# Patient Record
Sex: Male | Born: 1970 | Race: Black or African American | Hispanic: No | Marital: Single | Smoking: Never smoker
Health system: Southern US, Community
[De-identification: ages and names within clinical notes are randomized; demographics above are authoritative.]

## PROBLEM LIST (undated history)

## (undated) DIAGNOSIS — I10 Essential (primary) hypertension: Secondary | ICD-10-CM

## (undated) DIAGNOSIS — R7401 Elevation of levels of liver transaminase levels: Secondary | ICD-10-CM

## (undated) DIAGNOSIS — B20 Human immunodeficiency virus [HIV] disease: Secondary | ICD-10-CM

## (undated) DIAGNOSIS — K716 Toxic liver disease with hepatitis, not elsewhere classified: Secondary | ICD-10-CM

## (undated) DIAGNOSIS — R740 Nonspecific elevation of levels of transaminase and lactic acid dehydrogenase [LDH]: Secondary | ICD-10-CM

## (undated) DIAGNOSIS — Z21 Asymptomatic human immunodeficiency virus [HIV] infection status: Secondary | ICD-10-CM

## (undated) DIAGNOSIS — R7989 Other specified abnormal findings of blood chemistry: Secondary | ICD-10-CM

## (undated) DIAGNOSIS — D649 Anemia, unspecified: Secondary | ICD-10-CM

## (undated) DIAGNOSIS — N62 Hypertrophy of breast: Secondary | ICD-10-CM

## (undated) DIAGNOSIS — T50905A Adverse effect of unspecified drugs, medicaments and biological substances, initial encounter: Secondary | ICD-10-CM

## (undated) HISTORY — DX: Asymptomatic human immunodeficiency virus (hiv) infection status: Z21

## (undated) HISTORY — DX: Nonspecific elevation of levels of transaminase and lactic acid dehydrogenase (ldh): R74.0

## (undated) HISTORY — DX: Elevation of levels of liver transaminase levels: R74.01

## (undated) HISTORY — DX: Toxic liver disease with hepatitis, not elsewhere classified: K71.6

## (undated) HISTORY — DX: Adverse effect of unspecified drugs, medicaments and biological substances, initial encounter: T50.905A

## (undated) HISTORY — DX: Essential (primary) hypertension: I10

## (undated) HISTORY — DX: Human immunodeficiency virus (HIV) disease: B20

---
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
## 2001-11-23 ENCOUNTER — Emergency Department (HOSPITAL_COMMUNITY): Admission: EM | Admit: 2001-11-23 | Discharge: 2001-11-23 | Payer: Self-pay | Admitting: *Deleted

## 2006-07-21 ENCOUNTER — Ambulatory Visit: Payer: Self-pay | Admitting: Internal Medicine

## 2006-07-21 LAB — CONVERTED CEMR LAB
ALT: 26 units/L (ref 0–40)
AST: 21 units/L (ref 0–37)
Albumin: 4 g/dL (ref 3.5–5.2)
Alkaline Phosphatase: 66 units/L (ref 39–117)
BUN: 14 mg/dL (ref 6–23)
Basophils Absolute: 0 10*3/uL (ref 0.0–0.1)
Basophils Relative: 0.3 % (ref 0.0–1.0)
Bilirubin, Direct: 0.1 mg/dL (ref 0.0–0.3)
CO2: 27 meq/L (ref 19–32)
Calcium: 9.2 mg/dL (ref 8.4–10.5)
Chloride: 105 meq/L (ref 96–112)
Cholesterol: 150 mg/dL (ref 0–200)
Creatinine, Ser: 1 mg/dL (ref 0.4–1.5)
Eosinophils Absolute: 0.1 10*3/uL (ref 0.0–0.6)
Eosinophils Relative: 1.2 % (ref 0.0–5.0)
GFR calc Af Amer: 109 mL/min
GFR calc non Af Amer: 90 mL/min
Glucose, Bld: 92 mg/dL (ref 70–99)
HCT: 40 % (ref 39.0–52.0)
HDL: 43.8 mg/dL (ref >39.0)
Hemoglobin: 12.8 g/dL — ABNORMAL LOW (ref 13.0–17.0)
LDL Cholesterol: 87 mg/dL (ref 0–99)
Lymphocytes Relative: 31.8 % (ref 12.0–46.0)
MCHC: 32 g/dL (ref 30.0–36.0)
MCV: 67.7 fL — ABNORMAL LOW (ref 78.0–100.0)
Monocytes Absolute: 0.6 10*3/uL (ref 0.2–0.7)
Monocytes Relative: 10.1 % (ref 3.0–11.0)
Neutro Abs: 3.4 10*3/uL (ref 1.4–7.7)
Neutrophils Relative %: 56.6 % (ref 43.0–77.0)
Platelets: 198 10*3/uL (ref 150–400)
Potassium: 4.1 meq/L (ref 3.5–5.1)
RBC: 5.91 M/uL — ABNORMAL HIGH (ref 4.22–5.81)
RDW: 13.9 % (ref 11.5–14.6)
Sodium: 139 meq/L (ref 135–145)
TSH: 0.74 microintl units/mL (ref 0.35–5.50)
Total Bilirubin: 0.7 mg/dL (ref 0.3–1.2)
Total CHOL/HDL Ratio: 3.4
Total Protein: 7.4 g/dL (ref 6.0–8.3)
Triglycerides: 97 mg/dL (ref 0–149)
VLDL: 19 mg/dL (ref 0–40)
WBC: 6 10*3/uL (ref 4.5–10.5)

## 2006-07-28 ENCOUNTER — Ambulatory Visit: Payer: Self-pay | Admitting: Internal Medicine

## 2006-07-28 LAB — CONVERTED CEMR LAB
HIV-1 antibody: POSITIVE — AB
HIV-2 Ab: UNDETERMINED — AB
HIV: REACTIVE

## 2006-10-08 ENCOUNTER — Ambulatory Visit: Payer: Self-pay | Admitting: Internal Medicine

## 2006-10-08 ENCOUNTER — Encounter: Admission: RE | Admit: 2006-10-08 | Discharge: 2006-10-08 | Payer: Self-pay | Admitting: Internal Medicine

## 2006-10-08 DIAGNOSIS — B20 Human immunodeficiency virus [HIV] disease: Secondary | ICD-10-CM

## 2006-10-08 DIAGNOSIS — A749 Chlamydial infection, unspecified: Secondary | ICD-10-CM

## 2006-10-23 ENCOUNTER — Ambulatory Visit: Payer: Self-pay | Admitting: Internal Medicine

## 2006-11-04 ENCOUNTER — Encounter (INDEPENDENT_AMBULATORY_CARE_PROVIDER_SITE_OTHER): Payer: Self-pay | Admitting: *Deleted

## 2006-11-11 ENCOUNTER — Encounter (INDEPENDENT_AMBULATORY_CARE_PROVIDER_SITE_OTHER): Payer: Self-pay | Admitting: *Deleted

## 2006-11-13 ENCOUNTER — Encounter: Payer: Self-pay | Admitting: Internal Medicine

## 2006-12-30 ENCOUNTER — Ambulatory Visit: Payer: Self-pay | Admitting: Internal Medicine

## 2006-12-30 ENCOUNTER — Encounter: Admission: RE | Admit: 2006-12-30 | Discharge: 2006-12-30 | Payer: Self-pay | Admitting: Internal Medicine

## 2006-12-30 LAB — CONVERTED CEMR LAB
AST: 25 units/L (ref 0–37)
Albumin: 4.4 g/dL (ref 3.5–5.2)
BUN: 13 mg/dL (ref 6–23)
CO2: 28 meq/L (ref 19–32)
Calcium: 9.9 mg/dL (ref 8.4–10.5)
Chloride: 103 meq/L (ref 96–112)
Glucose, Bld: 101 mg/dL — ABNORMAL HIGH (ref 70–99)
HIV 1 RNA Quant: 193000 copies/mL — ABNORMAL HIGH (ref <50)
HIV-1 RNA Quant, Log: 5.29 — ABNORMAL HIGH (ref <1.70)
Hemoglobin: 12.6 g/dL — ABNORMAL LOW (ref 13.0–17.0)
Lymphocytes Relative: 41 % (ref 12–46)
Lymphs Abs: 2.2 10*3/uL (ref 0.7–3.3)
Monocytes Relative: 8 % (ref 3–11)
Neutro Abs: 2.6 10*3/uL (ref 1.7–7.7)
Neutrophils Relative %: 48 % (ref 43–77)
Potassium: 4.1 meq/L (ref 3.5–5.3)
RBC: 5.64 M/uL (ref 4.22–5.81)
WBC: 5.4 10*3/uL (ref 4.0–10.5)

## 2007-01-11 ENCOUNTER — Encounter (INDEPENDENT_AMBULATORY_CARE_PROVIDER_SITE_OTHER): Payer: Self-pay | Admitting: *Deleted

## 2007-01-13 ENCOUNTER — Ambulatory Visit: Payer: Self-pay | Admitting: Internal Medicine

## 2007-03-25 ENCOUNTER — Ambulatory Visit: Payer: Self-pay | Admitting: Internal Medicine

## 2007-03-25 ENCOUNTER — Encounter: Admission: RE | Admit: 2007-03-25 | Discharge: 2007-03-25 | Payer: Self-pay | Admitting: Internal Medicine

## 2007-03-25 LAB — CONVERTED CEMR LAB
AST: 17 units/L (ref 0–37)
Albumin: 4.3 g/dL (ref 3.5–5.2)
Alkaline Phosphatase: 80 units/L (ref 39–117)
Eosinophils Absolute: 0.1 10*3/uL (ref 0.0–0.7)
Glucose, Bld: 82 mg/dL (ref 70–99)
HIV-1 RNA Quant, Log: 5.34 — ABNORMAL HIGH (ref <1.70)
Lymphs Abs: 2 10*3/uL (ref 0.7–3.3)
MCV: 69.8 fL — ABNORMAL LOW (ref 78.0–100.0)
Neutro Abs: 2 10*3/uL (ref 1.7–7.7)
Neutrophils Relative %: 42 % — ABNORMAL LOW (ref 43–77)
Platelets: 184 10*3/uL (ref 150–400)
Potassium: 4.2 meq/L (ref 3.5–5.3)
Sodium: 140 meq/L (ref 135–145)
Total Protein: 7.8 g/dL (ref 6.0–8.3)
WBC: 4.7 10*3/uL (ref 4.0–10.5)

## 2007-04-13 ENCOUNTER — Ambulatory Visit: Payer: Self-pay | Admitting: Internal Medicine

## 2007-05-17 ENCOUNTER — Encounter: Payer: Self-pay | Admitting: Internal Medicine

## 2007-05-18 ENCOUNTER — Encounter (INDEPENDENT_AMBULATORY_CARE_PROVIDER_SITE_OTHER): Payer: Self-pay | Admitting: *Deleted

## 2007-06-16 ENCOUNTER — Encounter: Admission: RE | Admit: 2007-06-16 | Discharge: 2007-06-16 | Payer: Self-pay | Admitting: Internal Medicine

## 2007-06-16 ENCOUNTER — Ambulatory Visit: Payer: Self-pay | Admitting: Internal Medicine

## 2007-06-16 LAB — CONVERTED CEMR LAB
ALT: 20 units/L (ref 0–53)
Alkaline Phosphatase: 95 units/L (ref 39–117)
Basophils Absolute: 0 10*3/uL (ref 0.0–0.1)
Eosinophils Absolute: 0.2 10*3/uL (ref 0.0–0.7)
Eosinophils Relative: 3 % (ref 0–5)
HCT: 38.5 % — ABNORMAL LOW (ref 39.0–52.0)
HIV-1 RNA Quant, Log: 5.49 — ABNORMAL HIGH (ref <1.70)
MCV: 68.5 fL — ABNORMAL LOW (ref 78.0–100.0)
Platelets: 204 10*3/uL (ref 150–400)
RDW: 15.3 % (ref 11.5–15.5)
Sodium: 142 meq/L (ref 135–145)
Total Bilirubin: 0.4 mg/dL (ref 0.3–1.2)
Total Protein: 8.3 g/dL (ref 6.0–8.3)

## 2007-07-06 ENCOUNTER — Encounter (INDEPENDENT_AMBULATORY_CARE_PROVIDER_SITE_OTHER): Payer: Self-pay | Admitting: *Deleted

## 2007-07-07 ENCOUNTER — Ambulatory Visit: Payer: Self-pay | Admitting: Internal Medicine

## 2007-10-04 ENCOUNTER — Encounter: Admission: RE | Admit: 2007-10-04 | Discharge: 2007-10-04 | Payer: Self-pay | Admitting: Internal Medicine

## 2007-10-04 ENCOUNTER — Ambulatory Visit: Payer: Self-pay | Admitting: Internal Medicine

## 2007-10-04 LAB — CONVERTED CEMR LAB
ALT: 19 units/L (ref 0–53)
BUN: 14 mg/dL (ref 6–23)
Basophils Absolute: 0 10*3/uL (ref 0.0–0.1)
CO2: 26 meq/L (ref 19–32)
Creatinine, Ser: 1.07 mg/dL (ref 0.40–1.50)
Eosinophils Relative: 2 % (ref 0–5)
HCT: 36.2 % — ABNORMAL LOW (ref 39.0–52.0)
HIV 1 RNA Quant: 207000 copies/mL — ABNORMAL HIGH (ref <50)
HIV-1 RNA Quant, Log: 5.32 — ABNORMAL HIGH (ref <1.70)
Hemoglobin: 11.2 g/dL — ABNORMAL LOW (ref 13.0–17.0)
Lymphocytes Relative: 47 % — ABNORMAL HIGH (ref 12–46)
MCHC: 30.9 g/dL (ref 30.0–36.0)
Monocytes Absolute: 0.4 10*3/uL (ref 0.1–1.0)
Monocytes Relative: 11 % (ref 3–12)
RDW: 15 % (ref 11.5–15.5)
Total Bilirubin: 0.4 mg/dL (ref 0.3–1.2)

## 2007-10-19 ENCOUNTER — Ambulatory Visit: Payer: Self-pay | Admitting: Internal Medicine

## 2007-10-20 ENCOUNTER — Encounter (INDEPENDENT_AMBULATORY_CARE_PROVIDER_SITE_OTHER): Payer: Self-pay | Admitting: *Deleted

## 2007-10-29 ENCOUNTER — Telehealth: Payer: Self-pay | Admitting: Internal Medicine

## 2007-11-17 ENCOUNTER — Encounter (INDEPENDENT_AMBULATORY_CARE_PROVIDER_SITE_OTHER): Payer: Self-pay | Admitting: *Deleted

## 2007-11-26 ENCOUNTER — Encounter: Payer: Self-pay | Admitting: Internal Medicine

## 2007-11-30 ENCOUNTER — Encounter: Admission: RE | Admit: 2007-11-30 | Discharge: 2007-11-30 | Payer: Self-pay | Admitting: Internal Medicine

## 2007-11-30 ENCOUNTER — Ambulatory Visit: Payer: Self-pay | Admitting: Internal Medicine

## 2007-11-30 LAB — CONVERTED CEMR LAB
Albumin: 4.4 g/dL (ref 3.5–5.2)
BUN: 13 mg/dL (ref 6–23)
CO2: 25 meq/L (ref 19–32)
Calcium: 8.9 mg/dL (ref 8.4–10.5)
Chloride: 104 meq/L (ref 96–112)
Creatinine, Ser: 1.04 mg/dL (ref 0.40–1.50)
HIV-1 RNA Quant, Log: 5.63 — ABNORMAL HIGH (ref <1.70)
Hemoglobin: 12.1 g/dL — ABNORMAL LOW (ref 13.0–17.0)
Lymphocytes Relative: 46 % (ref 12–46)
Lymphs Abs: 2.2 10*3/uL (ref 0.7–4.0)
MCHC: 31.7 g/dL (ref 30.0–36.0)
Monocytes Absolute: 0.3 10*3/uL (ref 0.1–1.0)
Monocytes Relative: 5 % (ref 3–12)
Neutro Abs: 2.3 10*3/uL (ref 1.7–7.7)
Neutrophils Relative %: 47 % (ref 43–77)
Potassium: 4.2 meq/L (ref 3.5–5.3)
RBC: 5.67 M/uL (ref 4.22–5.81)
WBC: 4.8 10*3/uL (ref 4.0–10.5)

## 2007-12-01 ENCOUNTER — Encounter (INDEPENDENT_AMBULATORY_CARE_PROVIDER_SITE_OTHER): Payer: Self-pay | Admitting: *Deleted

## 2007-12-14 ENCOUNTER — Ambulatory Visit: Payer: Self-pay | Admitting: Internal Medicine

## 2007-12-14 DIAGNOSIS — L259 Unspecified contact dermatitis, unspecified cause: Secondary | ICD-10-CM

## 2008-01-13 ENCOUNTER — Ambulatory Visit: Payer: Self-pay | Admitting: Internal Medicine

## 2008-01-13 LAB — CONVERTED CEMR LAB
ALT: 15 units/L (ref 0–53)
AST: 21 units/L (ref 0–37)
Albumin: 4.4 g/dL (ref 3.5–5.2)
Alkaline Phosphatase: 80 units/L (ref 39–117)
Basophils Absolute: 0 10*3/uL (ref 0.0–0.1)
Basophils Relative: 0 % (ref 0–1)
Eosinophils Absolute: 0.2 10*3/uL (ref 0.0–0.7)
Eosinophils Relative: 3 % (ref 0–5)
Glucose, Bld: 91 mg/dL (ref 70–99)
HCT: 36.9 % — ABNORMAL LOW (ref 39.0–52.0)
HIV-1 RNA Quant, Log: 2.79 — ABNORMAL HIGH (ref <1.70)
MCHC: 31.4 g/dL (ref 30.0–36.0)
MCV: 67.5 fL — ABNORMAL LOW (ref 78.0–100.0)
Neutrophils Relative %: 55 % (ref 43–77)
Platelets: 205 10*3/uL (ref 150–400)
Potassium: 4.2 meq/L (ref 3.5–5.3)
RDW: 16.4 % — ABNORMAL HIGH (ref 11.5–15.5)
Sodium: 138 meq/L (ref 135–145)
Total Bilirubin: 0.2 mg/dL — ABNORMAL LOW (ref 0.3–1.2)
Total Protein: 7.5 g/dL (ref 6.0–8.3)

## 2008-02-02 ENCOUNTER — Ambulatory Visit: Payer: Self-pay | Admitting: Internal Medicine

## 2008-05-02 ENCOUNTER — Ambulatory Visit: Payer: Self-pay | Admitting: Internal Medicine

## 2008-05-02 LAB — CONVERTED CEMR LAB
Alkaline Phosphatase: 96 units/L (ref 39–117)
Basophils Absolute: 0 10*3/uL (ref 0.0–0.1)
CO2: 22 meq/L (ref 19–32)
Creatinine, Ser: 0.93 mg/dL (ref 0.40–1.50)
Eosinophils Absolute: 0.1 10*3/uL (ref 0.0–0.7)
Eosinophils Relative: 2 % (ref 0–5)
Glucose, Bld: 95 mg/dL (ref 70–99)
HCT: 39.7 % (ref 39.0–52.0)
HIV 1 RNA Quant: 258 copies/mL — ABNORMAL HIGH (ref <48)
HIV-1 RNA Quant, Log: 2.41 — ABNORMAL HIGH (ref <1.68)
Hemoglobin: 12.5 g/dL — ABNORMAL LOW (ref 13.0–17.0)
Lymphocytes Relative: 35 % (ref 12–46)
Lymphs Abs: 2.1 10*3/uL (ref 0.7–4.0)
MCV: 69.5 fL — ABNORMAL LOW (ref 78.0–100.0)
Monocytes Absolute: 0.4 10*3/uL (ref 0.1–1.0)
RDW: 15 % (ref 11.5–15.5)
Total Bilirubin: 0.2 mg/dL — ABNORMAL LOW (ref 0.3–1.2)

## 2008-05-16 ENCOUNTER — Ambulatory Visit: Payer: Self-pay | Admitting: Internal Medicine

## 2008-08-09 ENCOUNTER — Encounter (INDEPENDENT_AMBULATORY_CARE_PROVIDER_SITE_OTHER): Payer: Self-pay | Admitting: *Deleted

## 2008-08-14 ENCOUNTER — Ambulatory Visit: Payer: Self-pay | Admitting: Internal Medicine

## 2008-08-14 LAB — CONVERTED CEMR LAB
ALT: 14 units/L (ref 0–53)
AST: 12 units/L (ref 0–37)
Albumin: 4.6 g/dL (ref 3.5–5.2)
Basophils Relative: 0 % (ref 0–1)
Calcium: 9.9 mg/dL (ref 8.4–10.5)
Chloride: 106 meq/L (ref 96–112)
Eosinophils Absolute: 0.1 10*3/uL (ref 0.0–0.7)
MCHC: 31.5 g/dL (ref 30.0–36.0)
MCV: 68.3 fL — ABNORMAL LOW (ref 78.0–100.0)
Monocytes Relative: 6 % (ref 3–12)
Neutrophils Relative %: 61 % (ref 43–77)
Platelets: 203 10*3/uL (ref 150–400)
Potassium: 4.3 meq/L (ref 3.5–5.3)
RDW: 15.6 % — ABNORMAL HIGH (ref 11.5–15.5)
Total Protein: 7.3 g/dL (ref 6.0–8.3)

## 2008-08-29 ENCOUNTER — Ambulatory Visit: Payer: Self-pay | Admitting: Internal Medicine

## 2008-11-27 ENCOUNTER — Ambulatory Visit: Payer: Self-pay | Admitting: Internal Medicine

## 2008-11-27 ENCOUNTER — Encounter (INDEPENDENT_AMBULATORY_CARE_PROVIDER_SITE_OTHER): Payer: Self-pay | Admitting: *Deleted

## 2008-11-27 LAB — CONVERTED CEMR LAB
HIV 1 RNA Quant: 135 copies/mL — ABNORMAL HIGH (ref <48)
HIV-1 RNA Quant, Log: 2.13 — ABNORMAL HIGH (ref <1.68)

## 2008-11-28 LAB — CONVERTED CEMR LAB
Albumin: 4.2 g/dL (ref 3.5–5.2)
Alkaline Phosphatase: 1185 units/L — ABNORMAL HIGH (ref 39–117)
BUN: 13 mg/dL (ref 6–23)
Glucose, Bld: 102 mg/dL — ABNORMAL HIGH (ref 70–99)
Hemoglobin: 10.1 g/dL — ABNORMAL LOW (ref 13.0–17.0)
Lymphocytes Relative: 34 % (ref 12–46)
Lymphs Abs: 2.5 10*3/uL (ref 0.7–4.0)
MCHC: 31.2 g/dL (ref 30.0–36.0)
Monocytes Absolute: 0.5 10*3/uL (ref 0.1–1.0)
Monocytes Relative: 6 % (ref 3–12)
Neutro Abs: 4 10*3/uL (ref 1.7–7.7)
Potassium: 3.8 meq/L (ref 3.5–5.3)
RBC: 4.74 M/uL (ref 4.22–5.81)

## 2008-11-29 ENCOUNTER — Encounter (INDEPENDENT_AMBULATORY_CARE_PROVIDER_SITE_OTHER): Payer: Self-pay | Admitting: Licensed Clinical Social Worker

## 2008-11-29 DIAGNOSIS — K759 Inflammatory liver disease, unspecified: Secondary | ICD-10-CM

## 2008-11-30 ENCOUNTER — Ambulatory Visit (HOSPITAL_COMMUNITY): Admission: RE | Admit: 2008-11-30 | Discharge: 2008-11-30 | Payer: Self-pay | Admitting: Internal Medicine

## 2008-11-30 ENCOUNTER — Ambulatory Visit: Payer: Self-pay | Admitting: Internal Medicine

## 2008-11-30 LAB — CONVERTED CEMR LAB
HCV Ab: NEGATIVE
Hep A IgM: NEGATIVE
Hepatitis B Surface Ag: NEGATIVE

## 2008-12-01 ENCOUNTER — Ambulatory Visit: Payer: Self-pay | Admitting: Internal Medicine

## 2008-12-01 ENCOUNTER — Encounter (INDEPENDENT_AMBULATORY_CARE_PROVIDER_SITE_OTHER): Payer: Self-pay | Admitting: Licensed Clinical Social Worker

## 2008-12-01 LAB — CONVERTED CEMR LAB
Albumin: 3.7 g/dL (ref 3.5–5.2)
Total Protein: 6.9 g/dL (ref 6.0–8.3)

## 2008-12-04 ENCOUNTER — Ambulatory Visit: Payer: Self-pay | Admitting: Internal Medicine

## 2008-12-04 LAB — CONVERTED CEMR LAB
Bilirubin, Direct: 0.8 mg/dL — ABNORMAL HIGH (ref 0.0–0.3)
Indirect Bilirubin: 0.7 mg/dL (ref 0.0–0.9)

## 2008-12-05 ENCOUNTER — Telehealth: Payer: Self-pay | Admitting: Internal Medicine

## 2008-12-06 ENCOUNTER — Ambulatory Visit: Payer: Self-pay | Admitting: Internal Medicine

## 2008-12-12 ENCOUNTER — Ambulatory Visit: Payer: Self-pay | Admitting: Internal Medicine

## 2008-12-13 LAB — CONVERTED CEMR LAB
Albumin: 4.6 g/dL (ref 3.5–5.2)
Alkaline Phosphatase: 799 units/L — ABNORMAL HIGH (ref 39–117)
Indirect Bilirubin: 0.6 mg/dL (ref 0.0–0.9)
Total Bilirubin: 1.1 mg/dL (ref 0.3–1.2)

## 2009-01-24 ENCOUNTER — Telehealth: Payer: Self-pay | Admitting: Internal Medicine

## 2009-01-26 ENCOUNTER — Ambulatory Visit: Payer: Self-pay | Admitting: Internal Medicine

## 2009-01-26 LAB — CONVERTED CEMR LAB
Alkaline Phosphatase: 1642 units/L — ABNORMAL HIGH (ref 39–117)
Bilirubin, Direct: 5.2 mg/dL — ABNORMAL HIGH (ref 0.0–0.3)
Indirect Bilirubin: 2.9 mg/dL — ABNORMAL HIGH (ref 0.0–0.9)
Total Protein: 7.1 g/dL (ref 6.0–8.3)

## 2009-01-31 ENCOUNTER — Ambulatory Visit (HOSPITAL_COMMUNITY): Admission: RE | Admit: 2009-01-31 | Discharge: 2009-01-31 | Payer: Self-pay | Admitting: Internal Medicine

## 2009-02-02 ENCOUNTER — Ambulatory Visit: Payer: Self-pay | Admitting: Internal Medicine

## 2009-02-02 LAB — CONVERTED CEMR LAB
ALT: 265 units/L — ABNORMAL HIGH (ref 0–53)
AST: 170 units/L — ABNORMAL HIGH (ref 0–37)
Albumin: 3.4 g/dL — ABNORMAL LOW (ref 3.5–5.2)
Alkaline Phosphatase: 1319 units/L — ABNORMAL HIGH (ref 39–117)
Bilirubin, Direct: 2.3 mg/dL — ABNORMAL HIGH (ref 0.0–0.3)
Indirect Bilirubin: 1.5 mg/dL — ABNORMAL HIGH (ref 0.0–0.9)
Total Bilirubin: 3.8 mg/dL — ABNORMAL HIGH (ref 0.3–1.2)
Total Protein: 6.5 g/dL (ref 6.0–8.3)

## 2009-02-09 ENCOUNTER — Encounter: Payer: Self-pay | Admitting: Internal Medicine

## 2009-02-09 ENCOUNTER — Telehealth (INDEPENDENT_AMBULATORY_CARE_PROVIDER_SITE_OTHER): Payer: Self-pay | Admitting: Licensed Clinical Social Worker

## 2009-02-09 ENCOUNTER — Ambulatory Visit: Payer: Self-pay | Admitting: Infectious Diseases

## 2009-02-09 LAB — CONVERTED CEMR LAB
ALT: 227 units/L — ABNORMAL HIGH (ref 0–53)
AST: 101 units/L — ABNORMAL HIGH (ref 0–37)
Albumin: 3.7 g/dL (ref 3.5–5.2)
Bilirubin, Direct: 1.2 mg/dL — ABNORMAL HIGH (ref 0.0–0.3)
Total Protein: 6.8 g/dL (ref 6.0–8.3)

## 2009-02-13 ENCOUNTER — Encounter: Payer: Self-pay | Admitting: Internal Medicine

## 2009-02-20 ENCOUNTER — Encounter: Payer: Self-pay | Admitting: Internal Medicine

## 2009-02-22 ENCOUNTER — Encounter: Payer: Self-pay | Admitting: Internal Medicine

## 2009-02-23 ENCOUNTER — Ambulatory Visit: Payer: Self-pay | Admitting: Internal Medicine

## 2009-02-23 LAB — CONVERTED CEMR LAB
ALT: 37 units/L (ref 0–53)
AST: 23 units/L (ref 0–37)
Albumin: 4.4 g/dL (ref 3.5–5.2)
Alkaline Phosphatase: 438 units/L — ABNORMAL HIGH (ref 39–117)
Basophils Absolute: 0 10*3/uL (ref 0.0–0.1)
Basophils Relative: 1 % (ref 0–1)
Eosinophils Absolute: 0.1 10*3/uL (ref 0.0–0.7)
MCHC: 30.3 g/dL (ref 30.0–36.0)
MCV: 72.9 fL — ABNORMAL LOW (ref >=78.0)
Monocytes Relative: 8 % (ref 3–12)
Neutro Abs: 3.4 10*3/uL (ref 1.7–7.7)
Neutrophils Relative %: 61 % (ref 43–77)
Potassium: 4.1 meq/L (ref 3.5–5.3)
RDW: 15.2 % (ref 11.5–15.5)
Sodium: 142 meq/L (ref 135–145)
Total Bilirubin: 1.4 mg/dL — ABNORMAL HIGH (ref 0.3–1.2)
Total Protein: 7.2 g/dL (ref 6.0–8.3)

## 2009-03-01 ENCOUNTER — Telehealth: Payer: Self-pay | Admitting: Internal Medicine

## 2009-03-26 ENCOUNTER — Encounter: Payer: Self-pay | Admitting: Internal Medicine

## 2009-04-09 ENCOUNTER — Ambulatory Visit: Payer: Self-pay | Admitting: Internal Medicine

## 2009-04-09 LAB — CONVERTED CEMR LAB
BUN: 13 mg/dL (ref 6–23)
Basophils Relative: 0 % (ref 0–1)
CO2: 24 meq/L (ref 19–32)
Calcium: 10.1 mg/dL (ref 8.4–10.5)
Chloride: 103 meq/L (ref 96–112)
Creatinine, Ser: 0.97 mg/dL (ref 0.40–1.50)
Eosinophils Absolute: 0.2 10*3/uL (ref 0.0–0.7)
Eosinophils Relative: 2 % (ref 0–5)
HCT: 39.7 % (ref 39.0–52.0)
HIV 1 RNA Quant: 188 copies/mL — ABNORMAL HIGH (ref <48)
HIV-1 RNA Quant, Log: 2.27 — ABNORMAL HIGH (ref <1.68)
Lymphs Abs: 3.3 10*3/uL (ref 0.7–4.0)
MCHC: 32.5 g/dL (ref 30.0–36.0)
MCV: 69 fL — ABNORMAL LOW (ref >=78.0)
Monocytes Relative: 6 % (ref 3–12)
Neutrophils Relative %: 56 % (ref 43–77)
Platelets: 251 10*3/uL (ref 150–400)
RBC: 5.75 M/uL (ref 4.22–5.81)
Total Bilirubin: 0.5 mg/dL (ref 0.3–1.2)
WBC: 9.2 10*3/uL (ref 4.0–10.5)

## 2009-04-24 ENCOUNTER — Ambulatory Visit: Payer: Self-pay | Admitting: Internal Medicine

## 2009-05-04 ENCOUNTER — Encounter: Payer: Self-pay | Admitting: Internal Medicine

## 2009-05-07 ENCOUNTER — Ambulatory Visit: Payer: Self-pay | Admitting: Internal Medicine

## 2009-05-08 LAB — CONVERTED CEMR LAB

## 2009-05-16 ENCOUNTER — Encounter: Payer: Self-pay | Admitting: Internal Medicine

## 2009-05-16 LAB — CONVERTED CEMR LAB
ALT: 31 units/L (ref 0–53)
AST: 23 units/L (ref 0–37)
Calcium: 10.1 mg/dL (ref 8.4–10.5)
Chloride: 103 meq/L (ref 96–112)
Creatinine, Ser: 0.97 mg/dL (ref 0.40–1.50)
Eosinophils Absolute: 0.2 10*3/uL (ref 0.0–0.7)
Lymphocytes Relative: 36 % (ref 12–46)
Lymphs Abs: 3.3 10*3/uL (ref 0.7–4.0)
Neutro Abs: 5.2 10*3/uL (ref 1.7–7.7)
Neutrophils Relative %: 56 % (ref 43–77)
Platelets: 251 10*3/uL (ref 150–400)
RBC: 5.75 M/uL (ref 4.22–5.81)
Sodium: 141 meq/L (ref 135–145)
Total Protein: 7.5 g/dL (ref 6.0–8.3)
WBC: 9.2 10*3/uL (ref 4.0–10.5)

## 2009-07-25 ENCOUNTER — Ambulatory Visit: Payer: Self-pay | Admitting: Internal Medicine

## 2009-07-25 LAB — CONVERTED CEMR LAB
AST: 27 units/L (ref 0–37)
Alkaline Phosphatase: 103 units/L (ref 39–117)
BUN: 11 mg/dL (ref 6–23)
Basophils Absolute: 0 10*3/uL (ref 0.0–0.1)
Creatinine, Ser: 1.08 mg/dL (ref 0.40–1.50)
Eosinophils Absolute: 0.3 10*3/uL (ref 0.0–0.7)
Eosinophils Relative: 4 % (ref 0–5)
HCT: 37.8 % — ABNORMAL LOW (ref 39.0–52.0)
HDL: 49 mg/dL (ref >39)
HIV 1 RNA Quant: 552 copies/mL — ABNORMAL HIGH (ref <48)
LDL Cholesterol: 103 mg/dL — ABNORMAL HIGH (ref 0–99)
Lymphocytes Relative: 37 % (ref 12–46)
MCV: 69.4 fL — ABNORMAL LOW (ref >=78.0)
Neutrophils Relative %: 52 % (ref 43–77)
Platelets: 210 10*3/uL (ref 150–400)
RDW: 16.4 % — ABNORMAL HIGH (ref 11.5–15.5)
Total CHOL/HDL Ratio: 3.4
VLDL: 16 mg/dL (ref 0–40)

## 2009-08-08 ENCOUNTER — Ambulatory Visit: Payer: Self-pay | Admitting: Internal Medicine

## 2009-11-06 ENCOUNTER — Ambulatory Visit: Payer: Self-pay | Admitting: Internal Medicine

## 2009-11-06 LAB — CONVERTED CEMR LAB
Alkaline Phosphatase: 102 units/L (ref 39–117)
Basophils Absolute: 0 10*3/uL (ref 0.0–0.1)
Basophils Relative: 0 % (ref 0–1)
Glucose, Bld: 97 mg/dL (ref 70–99)
HIV-1 RNA Quant, Log: <1.68 (ref <1.68)
MCHC: 30.9 g/dL (ref 30.0–36.0)
Monocytes Absolute: 0.4 10*3/uL (ref 0.1–1.0)
Neutro Abs: 3.5 10*3/uL (ref 1.7–7.7)
Neutrophils Relative %: 53 % (ref 43–77)
RDW: 15 % (ref 11.5–15.5)
Sodium: 140 meq/L (ref 135–145)
Total Bilirubin: 0.5 mg/dL (ref 0.3–1.2)
Total Protein: 7.4 g/dL (ref 6.0–8.3)

## 2009-11-26 ENCOUNTER — Ambulatory Visit: Payer: Self-pay | Admitting: Internal Medicine

## 2009-11-26 DIAGNOSIS — M722 Plantar fascial fibromatosis: Secondary | ICD-10-CM

## 2010-01-03 ENCOUNTER — Telehealth: Payer: Self-pay | Admitting: Internal Medicine

## 2010-01-03 ENCOUNTER — Ambulatory Visit: Payer: Self-pay | Admitting: Internal Medicine

## 2010-01-03 LAB — CONVERTED CEMR LAB

## 2010-01-11 ENCOUNTER — Encounter: Payer: Self-pay | Admitting: Internal Medicine

## 2010-03-05 ENCOUNTER — Ambulatory Visit: Payer: Self-pay | Admitting: Internal Medicine

## 2010-03-05 LAB — CONVERTED CEMR LAB
Albumin: 4.3 g/dL (ref 3.5–5.2)
BUN: 12 mg/dL (ref 6–23)
CO2: 22 meq/L (ref 19–32)
Calcium: 9.4 mg/dL (ref 8.4–10.5)
Chloride: 107 meq/L (ref 96–112)
Creatinine, Ser: 1.08 mg/dL (ref 0.40–1.50)
Glucose, Bld: 101 mg/dL — ABNORMAL HIGH (ref 70–99)
HIV 1 RNA Quant: <20 copies/mL (ref <20)
Hemoglobin: 12.7 g/dL — ABNORMAL LOW (ref 13.0–17.0)
Lymphocytes Relative: 43 % (ref 12–46)
Lymphs Abs: 2.8 10*3/uL (ref 0.7–4.0)
MCHC: 31.6 g/dL (ref 30.0–36.0)
Monocytes Absolute: 0.4 10*3/uL (ref 0.1–1.0)
Monocytes Relative: 6 % (ref 3–12)
Neutro Abs: 3.1 10*3/uL (ref 1.7–7.7)
RBC: 5.97 M/uL — ABNORMAL HIGH (ref 4.22–5.81)
WBC: 6.4 10*3/uL (ref 4.0–10.5)

## 2010-03-20 ENCOUNTER — Ambulatory Visit: Payer: Self-pay | Admitting: Internal Medicine

## 2010-06-16 LAB — CONVERTED CEMR LAB
ALT: 31 units/L (ref 0–53)
AST: 20 units/L (ref 0–37)
Albumin: 4.5 g/dL (ref 3.5–5.2)
Alkaline Phosphatase: 87 units/L (ref 39–117)
BUN: 13 mg/dL (ref 6–23)
Basophils Absolute: 0 10*3/uL (ref 0.0–0.1)
Basophils Relative: 0 % (ref 0–1)
Bilirubin Urine: NEGATIVE
CD4 T Cell Abs: 530
CO2: 26 meq/L (ref 19–32)
Calcium: 9.6 mg/dL (ref 8.4–10.5)
Chlamydia, Swab/Urine, PCR: POSITIVE — AB
Chloride: 105 meq/L (ref 96–112)
Creatinine, Ser: 0.93 mg/dL (ref 0.40–1.50)
Eosinophils Absolute: 0.2 10*3/uL (ref 0.0–0.7)
Eosinophils Relative: 3 % (ref 0–5)
GC Probe Amp, Urine: NEGATIVE
Glucose, Bld: 94 mg/dL (ref 70–99)
HCT: 38.2 % — ABNORMAL LOW (ref 39.0–52.0)
HCV Ab: NEGATIVE
HIV 1 RNA Quant: 321000 copies/mL — ABNORMAL HIGH (ref <50)
HIV-1 RNA Quant, Log: 5.51 — ABNORMAL HIGH (ref <1.70)
HIV-1 antibody: POSITIVE — AB
HIV-2 Ab: UNDETERMINED — AB
HIV: REACTIVE
Hemoglobin, Urine: NEGATIVE
Hemoglobin: 12.2 g/dL — ABNORMAL LOW (ref 13.0–17.0)
Hep B Core Total Ab: NEGATIVE
Hep B S Ab: NEGATIVE
Hepatitis B Surface Ag: NEGATIVE
Ketones, ur: NEGATIVE mg/dL
Leukocytes, UA: NEGATIVE
Lymphocytes Relative: 32 % (ref 12–46)
Lymphs Abs: 1.6 10*3/uL (ref 0.7–3.3)
MCHC: 31.9 g/dL (ref 30.0–36.0)
MCV: 66.9 fL — ABNORMAL LOW (ref 78.0–100.0)
Monocytes Absolute: 0.4 10*3/uL (ref 0.2–0.7)
Monocytes Relative: 8 % (ref 3–11)
Neutro Abs: 2.8 10*3/uL (ref 1.7–7.7)
Neutrophils Relative %: 57 % (ref 43–77)
Nitrite: NEGATIVE
Platelets: 174 10*3/uL (ref 150–400)
Potassium: 4.2 meq/L (ref 3.5–5.3)
Protein, ur: NEGATIVE mg/dL
RBC: 5.71 M/uL (ref 4.22–5.81)
RDW: 15.9 % — ABNORMAL HIGH (ref 11.5–14.0)
Sodium: 142 meq/L (ref 135–145)
Specific Gravity, Urine: 1.021 (ref 1.005–1.03)
Total Bilirubin: 0.4 mg/dL (ref 0.3–1.2)
Total Protein: 7.9 g/dL (ref 6.0–8.3)
Urine Glucose: NEGATIVE mg/dL
Urobilinogen, UA: 1 (ref 0.0–1.0)
WBC: 4.9 10*3/uL (ref 4.0–10.5)
pH: 6 (ref 5.0–8.0)

## 2010-06-18 NOTE — Assessment & Plan Note (Signed)
Summary: F/U/VS   CC:  3 month follow up.  History of Present Illness: Pt doing well. No missed doses of his HIV meds.  Preventive Screening-Counseling & Management  Alcohol-Tobacco     Alcohol drinks/day: 0     Alcohol type: red wine     Smoking Status: never     Passive Smoke Exposure: yes  Caffeine-Diet-Exercise     Caffeine use/day: soda occassionally,tea     Does Patient Exercise: yes     Type of exercise: gym membership     Exercise (avg: min/session): >60     Times/week: 3  Safety-Violence-Falls     Seat Belt Use: yes   Updated Prior Medication List: TRUVADA 200-300 MG TABS (EMTRICITABINE-TENOFOVIR) Take 1 tablet by mouth once a day ISENTRESS 400 MG TABS (RALTEGRAVIR POTASSIUM) Take 1 tablet by mouth two times a day  Current Allergies (reviewed today): ! * ATRIPLA Past History:  Past Medical History: Last updated: 10/23/2006 HIV disease  Review of Systems  The patient denies anorexia, fever, and weight loss.    Vital Signs:  Patient profile:   40 year old male Height:      68 inches (172.72 cm) Weight:      198.2 pounds (90.09 kg) BMI:     30.25 Temp:     97.2 degrees F (36.22 degrees C) oral Pulse rate:   60 / minute BP sitting:   155 / 89  (right arm)  Vitals Entered By: Baxter Hire) (August 08, 2009 8:49 AM) CC: 3 month follow up Pain Assessment Patient in pain? no      Nutritional Status BMI of > 30 = obese Nutritional Status Detail appetite is good per patient  Have you ever been in a relationship where you felt threatened, hurt or afraid?No   Does patient need assistance? Functional Status Self care Ambulation Normal   Physical Exam  General:  alert, well-developed, well-nourished, and well-hydrated.   Head:  normocephalic and atraumatic.   Mouth:  pharynx pink and moist.      Impression & Recommendations:  Problem # 1:  HIV DISEASE (ICD-042) Pt.s most recent CD4ct was 840 and VL 552 .  Pt instructed to continue  the current antiretroviral regimen.  Pt encouraged to take medication regularly and not miss doses.  Pt will f/u in 3 months for repeat blood work and will see me 2 weeks later.  Diagnostics Reviewed:  HIV: HIV positive - not AIDS (08/29/2008)   HIV-Western blot: Positive (10/08/2006)   CD4: 840 (07/26/2009)   WBC: 6.6 (07/25/2009)   Hgb: 12.0 (07/25/2009)   HCT: 37.8 (07/25/2009)   Platelets: 210 (07/25/2009) HIV-1 RNA: 552 (07/25/2009)   HBSAg: NEG (11/30/2008)  Other Orders: Est. Patient Level III ([address]) Future Orders: T-CD4SP ([facility]) (CD4SP) ... 11/06/2009 T-HIV Viral Load [phone]) ... 11/06/2009 T-Comprehensive Metabolic Panel [phone]) ... 11/06/2009 T-CBC w/Diff ([id number]) ... 11/06/2009  Patient Instructions: 1)  Please schedule a follow-up appointment in 3 months, 2 weeks after labs.  Process Orders Check Orders Results:     [facility]: ABN not required for this insurance Tests Sent for requisitioning (August 08, 2009 9:30 AM):     11/06/2009: [facility] -- T-HIV Viral Load [phone] (signed)     11/06/2009: [facility] -- T-Comprehensive Metabolic Panel [[id number]] (signed)     11/06/2009: [facility] -- [facility] w/Diff [[id number]] (signed)

## 2010-06-18 NOTE — Assessment & Plan Note (Signed)
Summary: F/U/VS   CC:  follow-up visit, lab results, and pt. c/o foot pain in AM.  History of Present Illness: Pt c/o some foot pain over his arch. He has not missed any doses of his HIV meds.  Preventive Screening-Counseling & Management  Alcohol-Tobacco     Alcohol drinks/day: occasional     Alcohol type: red wine     Smoking Status: never     Passive Smoke Exposure: yes  Caffeine-Diet-Exercise     Caffeine use/day: soda occassionally,tea     Does Patient Exercise: yes     Type of exercise: gym membership     Exercise (avg: min/session): >60     Times/week: 3  Hep-HIV-STD-Contraception     HIV Risk: risk noted  Safety-Violence-Falls     Seat Belt Use: yes     Seat Belt Counseling: not indicated; patient wears seat belts      Sexual History:  currently monogamous.        Drug Use:  never.        Blood Transfusions:  no.        Travel History:  none.    Comments: pt. given condoms   Updated Prior Medication List: TRUVADA 200-300 MG TABS (EMTRICITABINE-TENOFOVIR) Take 1 tablet by mouth once a day ISENTRESS 400 MG TABS (RALTEGRAVIR POTASSIUM) Take 1 tablet by mouth two times a day  Current Allergies (reviewed today): ! * ATRIPLA Past History:  Past Medical History: Last updated: 10/23/2006 HIV disease  Review of Systems  The patient denies anorexia, fever, and weight loss.    Vital Signs:  Patient profile:   40 year old male Height:      68 inches (172.72 cm) Weight:      196.0 pounds (89.09 kg) BMI:     29.91 Temp:     98.4 degrees F (36.89 degrees C) oral Pulse rate:   61 / minute BP sitting:   146 / 95  (right arm)  Vitals Entered By: Wendall Mola CMA Gregroy Dombkowski Dull) (March 20, 2010 8:54 AM) CC: follow-up visit, lab results, pt. c/o foot pain in AM Is Patient Diabetic? No Pain Assessment Patient in pain? no      Nutritional Status BMI of 25 - 29 = overweight Nutritional Status Detail appetite "good"  Have you ever been in a  relationship where you felt threatened, hurt or afraid?No   Does patient need assistance? Functional Status Self care Ambulation Normal Comments no missed doses of meds per pt.   Physical Exam  General:  alert, well-developed, well-nourished, and well-hydrated.   Head:  normocephalic and atraumatic.   Mouth:  pharynx pink and moist.   Lungs:  normal breath sounds.     Impression & Recommendations:  Problem # 1:  HIV DISEASE (ICD-042) Pt.s most recent CD4ct was 1070 and VL <20 .  Pt instructed to continue the current antiretroviral regimen.  Pt encouraged to take medication regularly and not miss doses.  Pt will f/u in 3 months for repeat blood work and will see me 2 weeks later.  Influenza vaccine given.  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 Patient Level III (47829) Future Orders: T-CD4SP ([facility]) (CD4SP) ... 06/18/2010 T-HIV Viral Load [phone]) ... 06/18/2010 T-Comprehensive Metabolic Panel [id number]) ... 06/18/2010 T-CBC w/Diff ([id number]) ... 06/18/2010  Patient Instructions: 1)  Please schedule a follow-up appointment in 3 months, 2 weeks after labs.      Immunizations Administered:  Influenza Vaccine # 1:    Vaccine Type: Fluvax Non-MCR    Site: right deltoid    Mfr: Novartis    Dose: 0.5 ml    Route: IM    Given by: Wendall Mola CMA ( AAMA)    Exp. Date: 08/18/2010    Lot #: [id number]    VIS given: 12/11/09 version given March 20, 2010.  Flu Vaccine Consent Questions:    Do you have a history of severe allergic reactions to this vaccine? no     Any prior history of allergic reactions to egg and/or gelatin? no    Do you have a sensitivity to the preservative Thimersol? no    Do you have a past history of Guillan-Barre Syndrome? no    Do you currently have an acute febrile illness? no    Have you ever had a severe reaction to latex? no    Vaccine information given and explained to patient? yes

## 2010-06-18 NOTE — Assessment & Plan Note (Signed)
Summary: F/U OV/VS   CC:  follow-up visit, lab results, and c/o foot pain.  History of Present Illness: Pt c/o sme right foot pain that is intermittant. It occurs in his arch area.  He is flat-footed.  No missed doses of his meds.  Preventive Screening-Counseling & Management  Alcohol-Tobacco     Alcohol drinks/day: occasional     Alcohol type: red wine     Smoking Status: never     Passive Smoke Exposure: yes  Caffeine-Diet-Exercise     Caffeine use/day: soda occassionally,tea     Does Patient Exercise: yes     Type of exercise: gym membership     Exercise (avg: min/session): >60     Times/week: 3  Hep-HIV-STD-Contraception     HIV Risk: no  Safety-Violence-Falls     Seat Belt Use: yes     Seat Belt Counseling: not indicated; patient wears seat belts      Sexual History:  currently monogamous.        Drug Use:  never.        Blood Transfusions:  no.        Travel History:  none.    Comments: pt. given condoms   Updated Prior Medication List: TRUVADA 200-300 MG TABS (EMTRICITABINE-TENOFOVIR) Take 1 tablet by mouth once a day ISENTRESS 400 MG TABS (RALTEGRAVIR POTASSIUM) Take 1 tablet by mouth two times a day  Current Allergies (reviewed today): ! * ATRIPLA Past History:  Past Medical History: Last updated: 10/23/2006 HIV disease  Review of Systems  The patient denies anorexia, fever, and weight loss.    Vital Signs:  Patient profile:   40 year old male Height:      68 inches (172.72 cm) Weight:      196.0 pounds (89.09 kg) BMI:     29.91 Temp:     97.8 degrees F (36.56 degrees C) oral Pulse rate:   46 / minute BP sitting:   142 / 87  (left arm)  Vitals Entered By: Wendall Mola CMA Duncan Dull) (November 26, 2009 9:10 AM) CC: follow-up visit, lab results, c/o foot pain Is Patient Diabetic? No Pain Assessment Patient in pain? no      Nutritional Status BMI of 25 - 29 = overweight Nutritional Status Detail appetite "good"  Does patient need  assistance? Functional Status Self care Ambulation Normal Comments no missed doses of meds per patient   Physical Exam  General:  alert, well-developed, well-nourished, and well-hydrated.   Head:  normocephalic and atraumatic.   Mouth:  pharynx pink and moist.   Lungs:  normal breath sounds.      Impression & Recommendations:  Problem # 1:  HIV DISEASE (ICD-042) Pt.s most recent CD4ct was 990 and VL <48 .  Pt instructed to continue the current antiretroviral regimen.  Pt encouraged to take medication regularly and not miss doses.  Pt will f/u in 3 months for repeat blood work and will see me 2 weeks later.  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 Patient Level III (16109) Future Orders: T-CD4SP ([facility]) (CD4SP) ... 02/24/2010 T-HIV Viral Load [id number]) ... 02/24/2010 T-Comprehensive Metabolic Panel [phone]) .Marland KitchenMarland Kitchen  02/24/2010 T-CBC w/Diff ([id number]) ... 02/24/2010  Patient Instructions: 1)  Please schedule a follow-up appointment in 3 months, 2 weeks after labs.

## 2010-06-18 NOTE — Letter (Signed)
Summary: FMLA  FMLA   Imported By: Florinda Marker 01/14/2010 15:31:10  _____________________________________________________________________  External Attachment:    Type:   Image     Comment:   External Document

## 2010-06-18 NOTE — Progress Notes (Signed)
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  DIS wanting pt. to be retested and retreated even if the results are negative.  Pt. knows that the Center will be calling to schedule lab appt. Tomasita Morrow RN  January 03, 2010 9:33 AM   Follow-up for Phone Call        check RPR PCN 2.4 million units IM Follow-up by: Yisroel Ramming MD,  January 07, 2010 4:42 PM  Additional Follow-up for Phone Call Additional follow up Details #1::        RPR done 01/03/10 was negative here and health dept. already treated once with Bicillian 2.4 milliion units.  Health Dept. checking confirmatory. Additional Follow-up by: Wendall Mola CMA Duncan Dull),  January 08, 2010 9:08 AM

## 2010-06-27 ENCOUNTER — Other Ambulatory Visit: Payer: Self-pay | Admitting: Adult Health

## 2010-06-27 ENCOUNTER — Other Ambulatory Visit (INDEPENDENT_AMBULATORY_CARE_PROVIDER_SITE_OTHER): Payer: Self-pay

## 2010-06-27 ENCOUNTER — Encounter: Payer: Self-pay | Admitting: Adult Health

## 2010-06-27 DIAGNOSIS — B20 Human immunodeficiency virus [HIV] disease: Secondary | ICD-10-CM

## 2010-06-27 LAB — CONVERTED CEMR LAB
ALT: 58 units/L — ABNORMAL HIGH (ref 0–53)
AST: 36 units/L (ref 0–37)
BUN: 11 mg/dL (ref 6–23)
Creatinine, Ser: 1 mg/dL (ref 0.40–1.50)
Eosinophils Absolute: 0.1 10*3/uL (ref 0.0–0.7)
Eosinophils Relative: 1 % (ref 0–5)
HCT: 42 % (ref 39.0–52.0)
HIV 1 RNA Quant: <20 copies/mL (ref <20)
Hemoglobin: 13 g/dL (ref 13.0–17.0)
Lymphocytes Relative: 19 % (ref 12–46)
Lymphs Abs: 1.4 10*3/uL (ref 0.7–4.0)
MCV: 70.2 fL — ABNORMAL LOW (ref 78.0–100.0)
Monocytes Absolute: 0.4 10*3/uL (ref 0.1–1.0)
Platelets: 192 10*3/uL (ref 150–400)
RDW: 15.7 % — ABNORMAL HIGH (ref 11.5–15.5)
Total Bilirubin: 0.6 mg/dL (ref 0.3–1.2)
WBC: 7.3 10*3/uL (ref 4.0–10.5)

## 2010-06-28 LAB — T-HELPER CELL (CD4) - (RCID CLINIC ONLY)
CD4 % Helper T Cell: 40 % (ref 33–55)
CD4 T Cell Abs: 610 uL (ref 400–2700)

## 2010-07-11 ENCOUNTER — Ambulatory Visit: Payer: Self-pay | Admitting: Internal Medicine

## 2010-07-11 ENCOUNTER — Encounter: Payer: Self-pay | Admitting: Adult Health

## 2010-07-11 ENCOUNTER — Ambulatory Visit (INDEPENDENT_AMBULATORY_CARE_PROVIDER_SITE_OTHER): Payer: Self-pay | Admitting: Adult Health

## 2010-07-11 DIAGNOSIS — I10 Essential (primary) hypertension: Secondary | ICD-10-CM | POA: Insufficient documentation

## 2010-07-11 DIAGNOSIS — IMO0002 Reserved for concepts with insufficient information to code with codable children: Secondary | ICD-10-CM

## 2010-07-11 LAB — CONVERTED CEMR LAB
HDL: 46 mg/dL (ref >39)
LDL Cholesterol: 104 mg/dL — ABNORMAL HIGH (ref 0–99)
Total CHOL/HDL Ratio: 3.8
Triglycerides: 113 mg/dL (ref <150)
VLDL: 23 mg/dL (ref 0–40)

## 2010-07-12 ENCOUNTER — Encounter (INDEPENDENT_AMBULATORY_CARE_PROVIDER_SITE_OTHER): Payer: Self-pay | Admitting: *Deleted

## 2010-07-16 NOTE — Assessment & Plan Note (Signed)
Summary: F/U/VS NEW TO NP   Vital Signs:  Patient profile:   40 year old male Height:      68 inches Weight:      197 pounds BMI:     30.06 Temp:     98 degrees F oral Pulse rate:   56 / minute BP sitting:   155 / 97  (right arm)  Vitals Entered By: Alesia Morin CMA (July 11, 2010 9:25 AM) CC: follow-up visit for labs Is Patient Diabetic? No Pain Assessment Patient in pain? no      Nutritional Status BMI of > 30 = obese Nutritional Status Detail appetite "good"  Have you ever been in a relationship where you felt threatened, hurt or afraid?No   Does patient need assistance? Functional Status Self care Ambulation Normal Comments no missed doses   CC:  follow-up visit for labs.  History of Present Illness: Feeling well.  No major complaints.  Adherent with meds.  Preventive Screening-Counseling & Management  Alcohol-Tobacco     Alcohol drinks/day: occasional     Alcohol type: red wine     Smoking Status: never     Passive Smoke Exposure: yes  Caffeine-Diet-Exercise     Caffeine use/day: soda occassionally,tea     Does Patient Exercise: yes     Type of exercise: gym membership     Exercise (avg: min/session): >60     Times/week: 3  Hep-HIV-STD-Contraception     HIV Risk: risk noted  Safety-Violence-Falls     Seat Belt Use: yes     Seat Belt Counseling: not indicated; patient wears seat belts      Sexual History:  currently monogamous.        Drug Use:  never.        Blood Transfusions:  no.        Travel History:  none.    Comments: pt declined condoms  Allergies: 1)  ! * Atripla  Past History:  Past medical, surgical, family and social histories (including risk factors) reviewed for relevance to current acute and chronic problems.  Past Medical History: Reviewed history from 10/23/2006 and no changes required. HIV disease  Family History: Reviewed history and no changes required.  Social History: Reviewed history from  10/23/2006 and no changes required. Never Smoked Alcohol use-yes  Review of Systems  The patient denies anorexia, fever, weight loss, weight gain, vision loss, decreased hearing, hoarseness, chest pain, syncope, dyspnea on exertion, peripheral edema, prolonged cough, headaches, hemoptysis, abdominal pain, melena, hematochezia, severe indigestion/heartburn, hematuria, incontinence, genital sores, muscle weakness, suspicious skin lesions, transient blindness, difficulty walking, depression, unusual weight change, abnormal bleeding, enlarged lymph nodes, angioedema, and testicular masses.    Physical Exam  General:  Well-developed,well-nourished,in no acute distress; alert,appropriate and cooperative throughout examination Head:  Normocephalic and atraumatic without obvious abnormalities. No apparent alopecia or balding. Eyes:  vision grossly intact, pupils equal, pupils round, and pupils reactive to light.   Ears:  R ear normal and L ear normal.   Mouth:  good dentition, no gingival abnormalities, no dental plaque, and pharynx pink and moist.   Neck:  supple, full ROM, and no masses.   Chest Wall:  no deformities, no tenderness, and no masses.   Lungs:  normal respiratory effort and normal breath sounds.   Heart:  normal rate, regular rhythm, no murmur, and no gallop.   Abdomen:  soft, non-tender, normal bowel sounds, no hepatomegaly, and no splenomegaly.   Msk:  normal ROM, no joint tenderness,  and no joint swelling.   Extremities:  No clubbing, cyanosis, edema, or deformity noted with normal full range of motion of all joints.   Neurologic:  alert & oriented X3, cranial nerves II-XII intact, strength normal in all extremities, and gait normal.   Skin:  turgor normal, color normal, no rashes, and no suspicious lesions.   Cervical Nodes:  No lymphadenopathy noted Axillary Nodes:  No palpable lymphadenopathy Psych:  Cognition and judgment appear intact. Alert and cooperative with normal  attention span and concentration. No apparent delusions, illusions, hallucinations   Impression & Recommendations:  Problem # 1:  HIV INFECTION (ICD-042) CD4 61 @ 40%.  VL <20 copies/ml.  Clinically stable.  Will CPM, repeat staging labs in 10 weeks with f/u in 3 months Future Orders: T-CBC w/Diff ([id number]) ... 09/19/2010  Problem # 2:  HYPERTENSION NEC (ICD-997.91) Systolic and diastolic elevations on separate measurements.  Start HCTZ 25mg  by mouth once daily and check lipids today.  [place] BP at least once weekly.  Sodium-restricted diet discussed.  Verbally acknowledged and agreed with plan. Orders: T-Lipid Profile ([id number]) Est. Patient Level III (99213)Future Orders: T-Lipid Profile ([id number]) ... 09/19/2010  Medications Added to Medication List This Visit: 1)  Hydrochlorothiazide 25 Mg Tabs (Hydrochlorothiazide) .Marland Kitchen.. 1 tablet by mouth once daily  Other Orders: Future Orders: T-CD4SP ([facility]) (CD4SP) ... 09/19/2010 T-GC Probe, urine [id number]) ... 09/19/2010 T-Chlamydia  Probe, urine [id number]) ... 09/19/2010 T-Comprehensive Metabolic Panel [id number]) ... 09/19/2010 T-HIV Viral Load [id number]) ... 09/19/2010 T-RPR (Syphilis) [phone]) ... 09/19/2010 T-Urinalysis (81003-65000) ... 09/19/2010 Prescriptions: HYDROCHLOROTHIAZIDE 25 MG TABS (HYDROCHLOROTHIAZIDE) 1 tablet by mouth once daily  #30 x 5   Entered and Authorized by:   Talmadge Chad NP   Signed by:   Talmadge Chad NP on 07/11/2010   Method used:   Electronically to        [facility] [phone]* (retail)       [address]       [address]       [place], [place]  [address]       Ph: [phone]       Fax: [phone]   RxID:   [id number]    Orders Added: 1)  T-Lipid Profile [id number] 2)  T-CBC w/Diff [[id number]] 3)  T-CD4SP [facility]) [CD4SP] 4)  T-GC Probe, urine [id number] 5)  T-Chlamydia  Probe, urine [phone] 6)  T-Comprehensive  Metabolic Panel [[id number]] 7)  T-HIV Viral Load [phone] 8)  T-RPR (Syphilis) [[id number]] 9)  T-Urinalysis [81003-65000] 10)  T-Lipid Profile [[id number]] 11)  Est. Patient Level III [42353]

## 2010-07-16 NOTE — Miscellaneous (Signed)
Summary: ADAP Update  Clinical Lists Changes  Observations: Added new observation of AIDSDAP: Pending-approval 2012 (07/12/2010 12:53) Added new observation of FAMILYSIZE: 3  (07/12/2010 12:53) Added new observation of HOUSEINCOME: 04540  (07/12/2010 12:53) Added new observation of FINASSESSDT: 07/11/2010  (07/12/2010 12:53)

## 2010-07-25 ENCOUNTER — Encounter (INDEPENDENT_AMBULATORY_CARE_PROVIDER_SITE_OTHER): Payer: Self-pay | Admitting: *Deleted

## 2010-07-30 NOTE — Miscellaneous (Signed)
Summary: adap approved til 02/16/11  Clinical Lists Changes  Observations: Added new observation of AIDSDAP: Yes 2012 (07/25/2010 16:23)

## 2010-07-31 LAB — T-HELPER CELL (CD4) - (RCID CLINIC ONLY)
CD4 % Helper T Cell: 38 % (ref 33–55)
CD4 T Cell Abs: 1070 uL (ref 400–2700)

## 2010-08-02 ENCOUNTER — Encounter: Payer: Self-pay | Admitting: Infectious Disease

## 2010-08-11 LAB — T-HELPER CELL (CD4) - (RCID CLINIC ONLY): CD4 T Cell Abs: 840 uL (ref 400–2700)

## 2010-08-21 LAB — T-HELPER CELL (CD4) - (RCID CLINIC ONLY): CD4 % Helper T Cell: 40 % (ref 33–55)

## 2010-08-22 LAB — T-HELPER CELL (CD4) - (RCID CLINIC ONLY)
CD4 % Helper T Cell: 41 % (ref 33–55)
CD4 T Cell Abs: 650 uL (ref 400–2700)

## 2010-08-25 LAB — T-HELPER CELL (CD4) - (RCID CLINIC ONLY)
CD4 % Helper T Cell: 37 % (ref 33–55)
CD4 T Cell Abs: 1320 uL (ref 400–2700)

## 2010-08-29 LAB — T-HELPER CELL (CD4) - (RCID CLINIC ONLY): CD4 T Cell Abs: 670 uL (ref 400–2700)

## 2010-09-25 ENCOUNTER — Other Ambulatory Visit (INDEPENDENT_AMBULATORY_CARE_PROVIDER_SITE_OTHER): Payer: Self-pay

## 2010-09-25 DIAGNOSIS — B20 Human immunodeficiency virus [HIV] disease: Secondary | ICD-10-CM

## 2010-09-25 LAB — CBC WITH DIFFERENTIAL/PLATELET
Basophils Absolute: 0 10*3/uL (ref 0.0–0.1)
Basophils Relative: 0 % (ref 0–1)
Eosinophils Absolute: 0.1 10*3/uL (ref 0.0–0.7)
Eosinophils Relative: 1 % (ref 0–5)
Lymphs Abs: 2.1 10*3/uL (ref 0.7–4.0)
MCH: 21.8 pg — ABNORMAL LOW (ref 26.0–34.0)
MCHC: 31.8 g/dL (ref 30.0–36.0)
MCV: 68.5 fL — ABNORMAL LOW (ref 78.0–100.0)
Neutrophils Relative %: 59 % (ref 43–77)
Platelets: 220 10*3/uL (ref 150–400)
RBC: 5.78 MIL/uL (ref 4.22–5.81)
RDW: 15.1 % (ref 11.5–15.5)

## 2010-09-25 LAB — URINALYSIS, ROUTINE W REFLEX MICROSCOPIC
Bilirubin Urine: NEGATIVE
Hgb urine dipstick: NEGATIVE
Ketones, ur: NEGATIVE mg/dL
Nitrite: NEGATIVE
Specific Gravity, Urine: 1.024 (ref 1.005–1.030)
Urobilinogen, UA: 0.2 mg/dL (ref 0.0–1.0)

## 2010-09-25 LAB — LIPID PANEL
HDL: 43 mg/dL (ref >39)
LDL Cholesterol: 67 mg/dL (ref 0–99)
Triglycerides: 72 mg/dL (ref <150)
VLDL: 14 mg/dL (ref 0–40)

## 2010-09-26 LAB — COMPLETE METABOLIC PANEL WITH GFR
ALT: 20 U/L (ref 0–53)
BUN: 17 mg/dL (ref 6–23)
CO2: 27 mEq/L (ref 19–32)
Calcium: 9.5 mg/dL (ref 8.4–10.5)
Chloride: 102 mEq/L (ref 96–112)
Creat: 1.2 mg/dL (ref 0.40–1.50)
GFR, Est African American: >60 mL/min (ref >60)
GFR, Est Non African American: >60 mL/min (ref >60)
Glucose, Bld: 69 mg/dL — ABNORMAL LOW (ref 70–99)
Total Bilirubin: 0.5 mg/dL (ref 0.3–1.2)

## 2010-09-26 LAB — T-HELPER CELL (CD4) - (RCID CLINIC ONLY): CD4 T Cell Abs: 830 uL (ref 400–2700)

## 2010-09-26 LAB — RPR TITER: RPR Titer: 1:2 {titer}

## 2010-09-26 LAB — RPR: RPR Ser Ql: REACTIVE — AB

## 2010-09-26 LAB — HIV-1 RNA QUANT-NO REFLEX-BLD: HIV-1 RNA Quant, Log: <1.3 {Log} (ref <1.30)

## 2010-10-04 NOTE — Assessment & Plan Note (Signed)
[facility] OFFICE NOTE   [name], [name]                      MRN:          [id number]  DATE:07/28/2006                            DOB:          Feb 24, 1971    A 40 year old African-American male comes in today for a wellness exam.  He enjoys excellent health and takes no chronic medications.  He is  asymptomatic.   FAMILY HISTORY:  Positive for hypertension, otherwise unremarkable.   SOCIAL HISTORY:  He is single, works for [employer], remains  quite active at the gym and also karate classes.  Nonsmoker.   PHYSICAL EXAMINATION:  Blood pressure was 120/82, fundi, ears, nose and  throat clear.  NECK:  No bruits or adenopathy.  CHEST:  Clear.  CARDIOVASCULAR:  Normal heart sounds, no murmurs.  ABDOMEN:  Benign.  EXTERNAL GENITALIA:  Normal.  EXTREMITIES:  No edema.  Peripheral pulses full.   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  His mother also has mild chronic  anemia.   DISPOSITION:  Per his request, an HIV test will be obtained.  Laboratory  screen discussed.  Cholesterol of 150.  Will reassess in 3 years.     Gordy Savers, MD  Electronically Signed    PFK/MedQ  DD: 07/28/2006  DT: 07/30/2006  Job #: [id number]

## 2010-10-09 ENCOUNTER — Ambulatory Visit (INDEPENDENT_AMBULATORY_CARE_PROVIDER_SITE_OTHER): Payer: Self-pay | Admitting: Adult Health

## 2010-10-09 ENCOUNTER — Encounter: Payer: Self-pay | Admitting: Adult Health

## 2010-10-09 VITALS — BP 147/87 | HR 71 | Temp 98.1°F | Ht 68.0 in | Wt 189.5 lb

## 2010-10-09 DIAGNOSIS — B20 Human immunodeficiency virus [HIV] disease: Secondary | ICD-10-CM

## 2010-10-09 DIAGNOSIS — A539 Syphilis, unspecified: Secondary | ICD-10-CM

## 2010-10-09 NOTE — Progress Notes (Signed)
  Subjective:    Patient ID: [name] [name], male    DOB: Aug 05, 1970, 40 y.o.   MRN: [id number]  HPI Doing well, adherent to medications with good tolerance and no complications. Voices no physical complaints.   Review of Systems  Constitutional: Negative.   HENT: Negative.   Eyes: Negative.   Respiratory: Negative.   Cardiovascular: Negative.   Gastrointestinal: Negative.   Genitourinary: Negative.   Musculoskeletal: Negative.   Skin: Negative.   Neurological: Negative.   Hematological: Negative.   Psychiatric/Behavioral: Negative.        Objective:   Physical Exam  Constitutional: He is oriented to person, place, and time. He appears well-developed and well-nourished.  HENT:  Head: Normocephalic and atraumatic.  Nose: Nose normal.  Mouth/Throat: Oropharynx is clear and moist.  Eyes: Conjunctivae and EOM are normal. Pupils are equal, round, and reactive to light.  Neck: Normal range of motion. Neck supple.  Cardiovascular: Normal rate and regular rhythm.   Pulmonary/Chest: Effort normal and breath sounds normal.  Abdominal: Soft. Bowel sounds are normal.  Musculoskeletal: Normal range of motion.  Neurological: He is alert and oriented to person, place, and time. No cranial nerve deficit.  Skin: Skin is warm and dry.  Psychiatric: He has a normal mood and affect. His behavior is normal. Judgment and thought content normal.          Assessment & Plan:  1. HIV. Labs obtained 09/25/2010 show a CD4 count of 830 at 39% with a viral load of less than 20 copies per mL clinically stable on current regimen. Recommend continuing present management with repeat labs in 14 weeks and followup in 4 months. Verbally acknowledged and agreed with plan of care.

## 2010-10-09 NOTE — Progress Notes (Signed)
After patient was discharged from clinic, there was a second review of his lab profile, which showed a positive RPR with a titer of 1:2. A review of RPR is over the past 9 months showed nonreactive values. We will contact the patient have him return to clinic for repeat RPR titers remain elevated, we should consider treating for syphilis.

## 2010-10-10 ENCOUNTER — Other Ambulatory Visit (INDEPENDENT_AMBULATORY_CARE_PROVIDER_SITE_OTHER): Payer: Self-pay

## 2010-10-10 ENCOUNTER — Telehealth: Payer: Self-pay | Admitting: *Deleted

## 2010-10-10 DIAGNOSIS — B20 Human immunodeficiency virus [HIV] disease: Secondary | ICD-10-CM

## 2010-10-10 NOTE — Telephone Encounter (Addendum)
Called patient to inform him that his RPR was elevated and he needs to do a repeat in order to check for sphyllis asap. Patient called back and said he will come now for his retake of the RPR. I will advise the provider and have the orders ready for when the patient arrives.

## 2010-10-10 NOTE — Progress Notes (Signed)
Addended by: Talmadge Chad on: 10/10/2010 04:17 PM   Modules accepted: Orders

## 2010-10-11 LAB — RPR TITER: RPR Titer: 1:2 {titer}

## 2010-10-11 LAB — RPR: RPR Ser Ql: REACTIVE — AB

## 2010-10-11 LAB — T.PALLIDUM AB, TOTAL: T pallidum Antibodies (TP-PA): >8 S/CO — ABNORMAL HIGH (ref <0.90)

## 2010-11-27 ENCOUNTER — Ambulatory Visit: Payer: Self-pay

## 2010-12-05 ENCOUNTER — Ambulatory Visit: Payer: Self-pay

## 2010-12-09 ENCOUNTER — Telehealth: Payer: Self-pay | Admitting: *Deleted

## 2010-12-09 NOTE — Telephone Encounter (Signed)
Opened in error. Jennet Maduro, RN

## 2010-12-23 ENCOUNTER — Other Ambulatory Visit: Payer: Self-pay | Admitting: *Deleted

## 2010-12-23 DIAGNOSIS — I10 Essential (primary) hypertension: Secondary | ICD-10-CM

## 2010-12-23 MED ORDER — HYDROCHLOROTHIAZIDE 25 MG PO TABS: 25.0000 mg | ORAL_TABLET | Freq: Every day | ORAL | Status: DC

## 2011-01-22 ENCOUNTER — Other Ambulatory Visit (INDEPENDENT_AMBULATORY_CARE_PROVIDER_SITE_OTHER): Payer: Self-pay

## 2011-01-22 ENCOUNTER — Other Ambulatory Visit: Payer: Self-pay | Admitting: Infectious Diseases

## 2011-01-22 DIAGNOSIS — B20 Human immunodeficiency virus [HIV] disease: Secondary | ICD-10-CM

## 2011-01-23 LAB — COMPLETE METABOLIC PANEL WITH GFR
ALT: 32 U/L (ref 0–53)
AST: 26 U/L (ref 0–37)
Albumin: 4.6 g/dL (ref 3.5–5.2)
Alkaline Phosphatase: 86 U/L (ref 39–117)
GFR, Est Non African American: >60 mL/min (ref >60)
Glucose, Bld: 92 mg/dL (ref 70–99)
Potassium: 3.9 mEq/L (ref 3.5–5.3)
Sodium: 141 mEq/L (ref 135–145)
Total Bilirubin: 0.4 mg/dL (ref 0.3–1.2)
Total Protein: 7.4 g/dL (ref 6.0–8.3)

## 2011-01-23 LAB — CBC WITH DIFFERENTIAL/PLATELET
Basophils Relative: 0 % (ref 0–1)
Hemoglobin: 12.8 g/dL — ABNORMAL LOW (ref 13.0–17.0)
MCHC: 31.4 g/dL (ref 30.0–36.0)
Monocytes Relative: 6 % (ref 3–12)
Neutro Abs: 3.7 10*3/uL (ref 1.7–7.7)
Neutrophils Relative %: 53 % (ref 43–77)
Platelets: 202 10*3/uL (ref 150–400)
RBC: 5.95 MIL/uL — ABNORMAL HIGH (ref 4.22–5.81)

## 2011-01-23 LAB — T-HELPER CELL (CD4) - (RCID CLINIC ONLY): CD4 T Cell Abs: 1110 uL (ref 400–2700)

## 2011-01-24 LAB — HIV-1 RNA QUANT-NO REFLEX-BLD
HIV 1 RNA Quant: <20 copies/mL (ref <20)
HIV-1 RNA Quant, Log: <1.3 {Log} (ref <1.30)

## 2011-02-05 ENCOUNTER — Ambulatory Visit (INDEPENDENT_AMBULATORY_CARE_PROVIDER_SITE_OTHER): Payer: Self-pay | Admitting: Adult Health

## 2011-02-05 ENCOUNTER — Encounter: Payer: Self-pay | Admitting: Adult Health

## 2011-02-05 VITALS — BP 151/91 | HR 60 | Temp 97.8°F | Ht 68.0 in | Wt 187.0 lb

## 2011-02-05 DIAGNOSIS — IMO0002 Reserved for concepts with insufficient information to code with codable children: Secondary | ICD-10-CM

## 2011-02-05 DIAGNOSIS — B20 Human immunodeficiency virus [HIV] disease: Secondary | ICD-10-CM

## 2011-02-05 DIAGNOSIS — Z23 Encounter for immunization: Secondary | ICD-10-CM

## 2011-02-13 LAB — T-HELPER CELL (CD4) - (RCID CLINIC ONLY): CD4 % Helper T Cell: 21 — ABNORMAL LOW

## 2011-02-21 LAB — T-HELPER CELL (CD4) - (RCID CLINIC ONLY)
CD4 % Helper T Cell: 35 % (ref 33–55)
CD4 T Cell Abs: 700 uL (ref 400–2700)

## 2011-02-25 LAB — T-HELPER CELL (CD4) - (RCID CLINIC ONLY): CD4 % Helper T Cell: 26 — ABNORMAL LOW

## 2011-05-15 ENCOUNTER — Other Ambulatory Visit: Payer: Self-pay | Admitting: *Deleted

## 2011-05-15 DIAGNOSIS — B20 Human immunodeficiency virus [HIV] disease: Secondary | ICD-10-CM

## 2011-05-15 MED ORDER — RALTEGRAVIR POTASSIUM 400 MG PO TABS: 400.0000 mg | ORAL_TABLET | Freq: Two times a day (BID) | ORAL | Status: DC

## 2011-05-15 MED ORDER — EMTRICITABINE-TENOFOVIR DF 200-300 MG PO TABS: 1.0000 | ORAL_TABLET | Freq: Every day | ORAL | Status: DC

## 2011-05-26 ENCOUNTER — Other Ambulatory Visit (INDEPENDENT_AMBULATORY_CARE_PROVIDER_SITE_OTHER): Payer: Self-pay

## 2011-05-26 DIAGNOSIS — B20 Human immunodeficiency virus [HIV] disease: Secondary | ICD-10-CM

## 2011-05-26 DIAGNOSIS — Z79899 Other long term (current) drug therapy: Secondary | ICD-10-CM

## 2011-05-26 DIAGNOSIS — Z113 Encounter for screening for infections with a predominantly sexual mode of transmission: Secondary | ICD-10-CM

## 2011-05-26 LAB — COMPLETE METABOLIC PANEL WITH GFR
AST: 18 U/L (ref 0–37)
Alkaline Phosphatase: 65 U/L (ref 39–117)
BUN: 15 mg/dL (ref 6–23)
Creat: 1.16 mg/dL (ref 0.50–1.35)
Total Bilirubin: 0.5 mg/dL (ref 0.3–1.2)

## 2011-05-26 LAB — CBC WITH DIFFERENTIAL/PLATELET
Eosinophils Relative: 1 % (ref 0–5)
HCT: 40.1 % (ref 39.0–52.0)
Hemoglobin: 12.9 g/dL — ABNORMAL LOW (ref 13.0–17.0)
Lymphocytes Relative: 38 % (ref 12–46)
MCHC: 32.2 g/dL (ref 30.0–36.0)
MCV: 68.9 fL — ABNORMAL LOW (ref 78.0–100.0)
Monocytes Absolute: 0.3 10*3/uL (ref 0.1–1.0)
Monocytes Relative: 6 % (ref 3–12)
Neutro Abs: 3.2 10*3/uL (ref 1.7–7.7)
WBC: 5.8 10*3/uL (ref 4.0–10.5)

## 2011-05-26 LAB — LIPID PANEL
Cholesterol: 167 mg/dL (ref 0–200)
Total CHOL/HDL Ratio: 4 Ratio
Triglycerides: 124 mg/dL (ref <150)
VLDL: 25 mg/dL (ref 0–40)

## 2011-05-27 LAB — T-HELPER CELL (CD4) - (RCID CLINIC ONLY)
CD4 % Helper T Cell: 41 % (ref 33–55)
CD4 T Cell Abs: 980 uL (ref 400–2700)

## 2011-05-28 LAB — HIV-1 RNA QUANT-NO REFLEX-BLD: HIV-1 RNA Quant, Log: <1.3 {Log} (ref <1.30)

## 2011-06-09 ENCOUNTER — Ambulatory Visit: Payer: Self-pay | Admitting: Infectious Disease

## 2011-06-17 NOTE — Assessment & Plan Note (Signed)
Clinically stable on current regimen. Continue present management.   

## 2011-06-17 NOTE — Assessment & Plan Note (Signed)
Did not take hydrochlorothiazide today as was originally planned. Instructed he should be taking his antihypertensives first thing in the morning. He should have his blood pressure checked at least twice a week and if it still remains elevated in spite of hydrochlorothiazide. Therapy, ACE. I therapy. Should also be added. Verbally acknowledged this information and agreed with plan of care

## 2011-06-17 NOTE — Progress Notes (Signed)
Patient ID: [name] [name], male   DOB: 1971-04-20, 41 y.o.   MRN: [id number] Subjective:    Patient ID: [name] [name] is a 41 y.o. male.  Chief Complaint: HIV Follow-up Visit [name] [name] is here for follow-up of HIV infection. He is feeling unchanged since his last visit.  He claims continued adherence to therapy with good tolerance and no complications. There are not additional complaints. He did relate that he did not take his hydrochlorothiazide. This morning as normally prescribed.  Data Review: Diagnostic studies reviewed.  Review of Systems - General ROS: negative for - fatigue, malaise or sleep disturbance Psychological ROS: negative for - anxiety, behavioral disorder, concentration difficulties, depression or mood swings Ophthalmic ROS: negative for - blurry vision, decreased vision or double vision Respiratory ROS: no cough, shortness of breath, or wheezing Cardiovascular ROS: no chest pain or dyspnea on exertion Gastrointestinal ROS: no abdominal pain, change in bowel habits, or black or bloody stools Neurological ROS: no TIA or stroke symptoms  Objective:  General appearance: alert, cooperative, appears stated age and no distress Head: Normocephalic, without obvious abnormality, atraumatic Neck: no adenopathy, no carotid bruit, no JVD, supple, symmetrical, trachea midline and thyroid not enlarged, symmetric, no tenderness/mass/nodules Resp: clear to auscultation bilaterally Cardio: regular rate and rhythm, S1, S2 normal, no murmur, click, rub or gallop GI: soft, non-tender; bowel sounds normal; no masses,  no organomegaly Neurologic: Alert and oriented X 3, normal strength and tone. Normal symmetric reflexes. Normal coordination and gait Skin:  No active lesions or rashes noted.     Psych:  No vegetative signs or delusional behaviors noted.    Laboratory: From 01/22/2011 ,  CD4 count was 1110 c/cmm @ 39 %. HIV viral load less than 20 copies per mL.      Assessment/Plan:   Human Immunodeficiency Virus (HIV) Disease Clinically stable on current regimen. Continue present management.  Counseling provided on prevention of transmission of HIV. Medication adherence discussed with patient. Follow up visit in 4 months with labs 2 weeks prior to appointment.   And  HYPERTENSION NEC Did not take hydrochlorothiazide today as was originally planned. Instructed he should be taking his antihypertensives first thing in the morning. He should have his blood pressure checked at least twice a week and if it still remains elevated in spite of hydrochlorothiazide. Therapy, ACE. I therapy. Should also be added. Verbally acknowledged this information and agreed with plan of care     [facility] A. Sundra Aland, MS, [facility] [phone]  06/17/2011, 9:24 AM

## 2011-06-19 ENCOUNTER — Ambulatory Visit (INDEPENDENT_AMBULATORY_CARE_PROVIDER_SITE_OTHER): Payer: Self-pay | Admitting: Infectious Disease

## 2011-06-19 ENCOUNTER — Encounter: Payer: Self-pay | Admitting: Infectious Disease

## 2011-06-19 VITALS — BP 163/83 | HR 64 | Temp 97.4°F | Wt 204.0 lb

## 2011-06-19 DIAGNOSIS — B20 Human immunodeficiency virus [HIV] disease: Secondary | ICD-10-CM

## 2011-06-19 DIAGNOSIS — N529 Male erectile dysfunction, unspecified: Secondary | ICD-10-CM

## 2011-06-19 DIAGNOSIS — IMO0002 Reserved for concepts with insufficient information to code with codable children: Secondary | ICD-10-CM

## 2011-06-19 DIAGNOSIS — Z23 Encounter for immunization: Secondary | ICD-10-CM

## 2011-06-19 MED ORDER — SILDENAFIL CITRATE 50 MG PO TABS: 50.0000 mg | ORAL_TABLET | ORAL | Status: DC | PRN

## 2011-06-19 MED ORDER — LISINOPRIL 20 MG PO TABS: 20.0000 mg | ORAL_TABLET | Freq: Every day | ORAL | Status: DC

## 2011-06-19 MED ORDER — EMTRICITABINE-TENOFOVIR DF 200-300 MG PO TABS: 1.0000 | ORAL_TABLET | Freq: Every day | ORAL | Status: DC

## 2011-06-19 MED ORDER — RALTEGRAVIR POTASSIUM 400 MG PO TABS: 400.0000 mg | ORAL_TABLET | Freq: Two times a day (BID) | ORAL | Status: DC

## 2011-06-19 NOTE — Progress Notes (Signed)
  Subjective:    Patient ID: [name] [name], male    DOB: 26-Jun-1970, 41 y.o.   MRN: [id number]  HPI  41 year old African American male who is doing superbly well on his antiviral regimen, with undetectable viral load and health cd4 count. He actually had been taking his isentress once daily two tabs. I asked him to either go back to one tab bid, or to crush the tablets if he is going to take them once daily  He did suffer increased problems with erectile dysfunction since starting HCTZ which improved after stopping the meds. He is sexually active only with his HIV positive (on meds and undetectable viral load0 partenr with whom he practices safe sex with a condom. Otherwise he has no major complaints other than muscle strain on right flank.   Review of Systems  Constitutional: Negative for fever, chills, diaphoresis, activity change, appetite change, fatigue and unexpected weight change.  HENT: Negative for congestion, sore throat, rhinorrhea, sneezing, trouble swallowing and sinus pressure.   Eyes: Negative for photophobia and visual disturbance.  Respiratory: Negative for cough, chest tightness, shortness of breath, wheezing and stridor.   Cardiovascular: Negative for chest pain, palpitations and leg swelling.  Gastrointestinal: Negative for nausea, vomiting, abdominal pain, diarrhea, constipation, blood in stool, abdominal distention and anal bleeding.  Genitourinary: Negative for dysuria, hematuria, flank pain and difficulty urinating.  Musculoskeletal: Negative for myalgias, back pain, joint swelling, arthralgias and gait problem.  Skin: Negative for color change, pallor, rash and wound.  Neurological: Negative for dizziness, tremors, weakness and light-headedness.  Hematological: Negative for adenopathy. Does not bruise/bleed easily.  Psychiatric/Behavioral: Negative for behavioral problems, confusion, sleep disturbance, dysphoric mood, decreased concentration and agitation.         Objective:   Physical Exam  Constitutional: He is oriented to person, place, and time. He appears well-developed and well-nourished. No distress.  HENT:  Head: Normocephalic and atraumatic.  Mouth/Throat: Oropharynx is clear and moist. No oropharyngeal exudate.  Eyes: Conjunctivae and EOM are normal. Pupils are equal, round, and reactive to light. No scleral icterus.  Neck: Normal range of motion. Neck supple. No JVD present.  Cardiovascular: Normal rate, regular rhythm and normal heart sounds.  Exam reveals no gallop and no friction rub.   No murmur heard. Pulmonary/Chest: Effort normal and breath sounds normal. No respiratory distress. He has no wheezes. He has no rales. He exhibits no tenderness.  Abdominal: He exhibits no distension and no mass. There is no tenderness. There is no rebound and no guarding.  Musculoskeletal: He exhibits no edema and no tenderness.  Lymphadenopathy:    He has no cervical adenopathy.  Neurological: He is alert and oriented to person, place, and time. He has normal reflexes. He exhibits normal muscle tone. Coordination normal.  Skin: Skin is warm and dry. He is not diaphoretic. No erythema. No pallor.  Psychiatric: He has a normal mood and affect. His behavior is normal. Judgment and thought content normal.          Assessment & Plan:  Human Immunodeficiency Virus (HIV) Disease Perfect control!  HYPERTENSION NEC Start ACEI and recheck bmp in one week  Erectile dysfunction Check testosterone and give viagra

## 2011-06-19 NOTE — Assessment & Plan Note (Signed)
Check testosterone and give viagra

## 2011-06-19 NOTE — Assessment & Plan Note (Signed)
Start ACEI and recheck bmp in one week

## 2011-06-19 NOTE — Assessment & Plan Note (Signed)
Perfect control 

## 2011-06-26 ENCOUNTER — Other Ambulatory Visit (INDEPENDENT_AMBULATORY_CARE_PROVIDER_SITE_OTHER): Payer: Self-pay

## 2011-06-26 DIAGNOSIS — IMO0002 Reserved for concepts with insufficient information to code with codable children: Secondary | ICD-10-CM

## 2011-06-26 LAB — BASIC METABOLIC PANEL
Chloride: 104 mEq/L (ref 96–112)
Creat: 1.35 mg/dL (ref 0.50–1.35)
Potassium: 4.2 mEq/L (ref 3.5–5.3)
Sodium: 139 mEq/L (ref 135–145)

## 2011-07-03 ENCOUNTER — Ambulatory Visit: Payer: Self-pay

## 2011-09-30 ENCOUNTER — Other Ambulatory Visit: Payer: Self-pay

## 2011-10-15 ENCOUNTER — Ambulatory Visit: Payer: Self-pay | Admitting: Infectious Disease

## 2011-11-12 ENCOUNTER — Other Ambulatory Visit (INDEPENDENT_AMBULATORY_CARE_PROVIDER_SITE_OTHER): Payer: Self-pay

## 2011-11-12 DIAGNOSIS — Z79899 Other long term (current) drug therapy: Secondary | ICD-10-CM

## 2011-11-12 DIAGNOSIS — B20 Human immunodeficiency virus [HIV] disease: Secondary | ICD-10-CM

## 2011-11-12 LAB — CBC WITH DIFFERENTIAL/PLATELET
Eosinophils Absolute: 0.2 10*3/uL (ref 0.0–0.7)
Eosinophils Relative: 3 % (ref 0–5)
Hemoglobin: 12 g/dL — ABNORMAL LOW (ref 13.0–17.0)
Lymphs Abs: 1.9 10*3/uL (ref 0.7–4.0)
MCH: 21.9 pg — ABNORMAL LOW (ref 26.0–34.0)
MCV: 67.3 fL — ABNORMAL LOW (ref 78.0–100.0)
Monocytes Absolute: 0.4 10*3/uL (ref 0.1–1.0)
Monocytes Relative: 7 % (ref 3–12)
Platelets: 174 10*3/uL (ref 150–400)
RBC: 5.48 MIL/uL (ref 4.22–5.81)

## 2011-11-12 LAB — COMPLETE METABOLIC PANEL WITH GFR
ALT: 22 U/L (ref 0–53)
AST: 21 U/L (ref 0–37)
Calcium: 8.6 mg/dL (ref 8.4–10.5)
Chloride: 102 mEq/L (ref 96–112)
Creat: 1.2 mg/dL (ref 0.50–1.35)

## 2011-11-12 LAB — LIPID PANEL
LDL Cholesterol: 67 mg/dL (ref 0–99)
Total CHOL/HDL Ratio: 2.9 Ratio
VLDL: 20 mg/dL (ref 0–40)

## 2011-11-13 LAB — T-HELPER CELL (CD4) - (RCID CLINIC ONLY)
CD4 % Helper T Cell: 45 % (ref 33–55)
CD4 T Cell Abs: 860 uL (ref 400–2700)

## 2011-11-13 LAB — HIV-1 RNA QUANT-NO REFLEX-BLD
HIV 1 RNA Quant: <20 copies/mL (ref <20)
HIV-1 RNA Quant, Log: <1.3 {Log} (ref <1.30)

## 2011-11-26 ENCOUNTER — Ambulatory Visit (INDEPENDENT_AMBULATORY_CARE_PROVIDER_SITE_OTHER): Payer: Self-pay | Admitting: Infectious Disease

## 2011-11-26 ENCOUNTER — Encounter: Payer: Self-pay | Admitting: Infectious Disease

## 2011-11-26 VITALS — BP 146/75 | HR 56 | Temp 97.9°F | Ht 68.0 in | Wt 190.0 lb

## 2011-11-26 DIAGNOSIS — B20 Human immunodeficiency virus [HIV] disease: Secondary | ICD-10-CM

## 2011-11-26 DIAGNOSIS — D649 Anemia, unspecified: Secondary | ICD-10-CM | POA: Insufficient documentation

## 2011-11-26 DIAGNOSIS — K625 Hemorrhage of anus and rectum: Secondary | ICD-10-CM | POA: Insufficient documentation

## 2011-11-26 DIAGNOSIS — Z113 Encounter for screening for infections with a predominantly sexual mode of transmission: Secondary | ICD-10-CM

## 2011-11-26 DIAGNOSIS — IMO0002 Reserved for concepts with insufficient information to code with codable children: Secondary | ICD-10-CM

## 2011-11-26 LAB — FOLATE: Folate: 15.6 ng/mL

## 2011-11-26 LAB — VITAMIN B12: Vitamin B-12: 504 pg/mL (ref 211–911)

## 2011-11-26 LAB — IRON AND TIBC
Iron: 72 ug/dL (ref 42–165)
UIBC: 248 ug/dL (ref 125–400)

## 2011-11-26 NOTE — Assessment & Plan Note (Signed)
Check iron studies. May be due to hemorrhoids. Will consider referral to GI for colonscopy

## 2011-11-26 NOTE — Assessment & Plan Note (Signed)
Superb control. 

## 2011-11-26 NOTE — Progress Notes (Signed)
  Subjective:    Patient ID: [name] [name], male    DOB: [birth year], 41 y.o.   MRN: [id number]  HPI  [name] [name] is a 41 y.o. male who is doing superbly well on his  antiviral regimen, isentress and truvada with undetectable viral load and health cd4 count. He has had problem with profuse diarrhea a week ago that then resolved followed by constipation, hard stools and trace blood in stools. He has had some pain with defecation. He was slightly anemic on last blood check and was weakly hemoccult positive here. I spent greater than 45 minutes with the patient including greater than 50% of time in face to face counsel of the patient and in coordination of their care.    Review of Systems  Constitutional: Negative for fever, chills, diaphoresis, activity change, appetite change, fatigue and unexpected weight change.  HENT: Negative for congestion, sore throat, rhinorrhea, sneezing, trouble swallowing and sinus pressure.   Eyes: Negative for photophobia and visual disturbance.  Respiratory: Negative for cough, chest tightness, shortness of breath, wheezing and stridor.   Cardiovascular: Negative for chest pain, palpitations and leg swelling.  Gastrointestinal: Positive for abdominal pain, diarrhea, constipation, blood in stool and rectal pain. Negative for nausea, vomiting, abdominal distention and anal bleeding.  Genitourinary: Negative for dysuria, hematuria, flank pain and difficulty urinating.  Musculoskeletal: Negative for myalgias, back pain, joint swelling, arthralgias and gait problem.  Skin: Negative for color change, pallor, rash and wound.  Neurological: Negative for dizziness, tremors, weakness and light-headedness.  Hematological: Negative for adenopathy. Does not bruise/bleed easily.  Psychiatric/Behavioral: Negative for behavioral problems, confusion, disturbed wake/sleep cycle, dysphoric mood, decreased concentration and agitation.       Objective:   Physical Exam    Constitutional: He is oriented to person, place, and time. He appears well-developed and well-nourished. No distress.  HENT:  Head: Normocephalic and atraumatic.  Mouth/Throat: Oropharynx is clear and moist. No oropharyngeal exudate.  Eyes: Conjunctivae and EOM are normal. Pupils are equal, round, and reactive to light. No scleral icterus.  Neck: Normal range of motion. Neck supple. No JVD present.  Cardiovascular: Normal rate, regular rhythm and normal heart sounds.  Exam reveals no gallop and no friction rub.   No murmur heard. Pulmonary/Chest: Effort normal and breath sounds normal. No respiratory distress. He has no wheezes. He has no rales. He exhibits no tenderness.  Abdominal: He exhibits no distension and no mass. There is no tenderness. There is no rebound and no guarding.  Genitourinary: Rectal exam shows tenderness. Rectal exam shows no external hemorrhoid, no fissure and anal tone normal. Guaiac positive stool.  Musculoskeletal: He exhibits no edema and no tenderness.  Lymphadenopathy:    He has no cervical adenopathy.  Neurological: He is alert and oriented to person, place, and time. He has normal reflexes. He exhibits normal muscle tone. Coordination normal.  Skin: Skin is warm and dry. He is not diaphoretic. No erythema. No pallor.  Psychiatric: He has a normal mood and affect. His behavior is normal. Judgment and thought content normal.          Assessment & Plan:  Human immunodeficiency virus (HIV) disease Superb control!  Anemia, normocytic normochromic Check iron studies, tsh, b12 folate, ldh, retic ferritin  HYPERTENSION NEC BP still up. He will buy home BP cuff. Needs to see his PCP as well  Blood per rectum Check iron studies. May be due to hemorrhoids. Will consider referral to GI for colonscopy

## 2011-11-26 NOTE — Addendum Note (Signed)
Addended by: Jennet Maduro D on: 11/26/2011 12:22 PM   Modules accepted: Orders

## 2011-11-26 NOTE — Assessment & Plan Note (Signed)
BP still up. He will buy home BP cuff. Needs to see his PCP as well

## 2011-11-26 NOTE — Assessment & Plan Note (Signed)
Check iron studies, tsh, b12 folate, ldh, retic ferritin

## 2011-12-12 ENCOUNTER — Ambulatory Visit: Payer: Self-pay

## 2012-01-23 ENCOUNTER — Other Ambulatory Visit: Payer: Self-pay | Admitting: *Deleted

## 2012-01-23 DIAGNOSIS — B20 Human immunodeficiency virus [HIV] disease: Secondary | ICD-10-CM

## 2012-01-23 MED ORDER — EMTRICITABINE-TENOFOVIR DF 200-300 MG PO TABS: 1.0000 | ORAL_TABLET | Freq: Every day | ORAL | Status: DC

## 2012-01-23 MED ORDER — RALTEGRAVIR POTASSIUM 400 MG PO TABS: 400.0000 mg | ORAL_TABLET | Freq: Two times a day (BID) | ORAL | Status: DC

## 2012-04-19 ENCOUNTER — Other Ambulatory Visit: Payer: Self-pay | Admitting: *Deleted

## 2012-04-19 DIAGNOSIS — IMO0002 Reserved for concepts with insufficient information to code with codable children: Secondary | ICD-10-CM

## 2012-04-19 MED ORDER — LISINOPRIL 20 MG PO TABS: 20.0000 mg | ORAL_TABLET | Freq: Every day | ORAL | Status: DC

## 2012-04-20 ENCOUNTER — Other Ambulatory Visit (INDEPENDENT_AMBULATORY_CARE_PROVIDER_SITE_OTHER): Payer: Self-pay

## 2012-04-20 ENCOUNTER — Ambulatory Visit (INDEPENDENT_AMBULATORY_CARE_PROVIDER_SITE_OTHER): Payer: Self-pay

## 2012-04-20 DIAGNOSIS — Z113 Encounter for screening for infections with a predominantly sexual mode of transmission: Secondary | ICD-10-CM

## 2012-04-20 DIAGNOSIS — Z79899 Other long term (current) drug therapy: Secondary | ICD-10-CM

## 2012-04-20 DIAGNOSIS — B20 Human immunodeficiency virus [HIV] disease: Secondary | ICD-10-CM

## 2012-04-20 DIAGNOSIS — Z23 Encounter for immunization: Secondary | ICD-10-CM

## 2012-04-20 LAB — CBC WITH DIFFERENTIAL/PLATELET
Basophils Absolute: 0 10*3/uL (ref 0.0–0.1)
Basophils Relative: 0 % (ref 0–1)
Eosinophils Absolute: 0.1 10*3/uL (ref 0.0–0.7)
Eosinophils Relative: 1 % (ref 0–5)
Lymphocytes Relative: 33 % (ref 12–46)
MCH: 21.5 pg — ABNORMAL LOW (ref 26.0–34.0)
MCHC: 31.8 g/dL (ref 30.0–36.0)
MCV: 67.6 fL — ABNORMAL LOW (ref 78.0–100.0)
Platelets: 191 10*3/uL (ref 150–400)
RDW: 17.3 % — ABNORMAL HIGH (ref 11.5–15.5)
WBC: 5.7 10*3/uL (ref 4.0–10.5)

## 2012-04-20 LAB — COMPLETE METABOLIC PANEL WITH GFR
AST: 18 U/L (ref 0–37)
Albumin: 4.2 g/dL (ref 3.5–5.2)
BUN: 11 mg/dL (ref 6–23)
Calcium: 9.5 mg/dL (ref 8.4–10.5)
Chloride: 104 mEq/L (ref 96–112)
Potassium: 3.9 mEq/L (ref 3.5–5.3)

## 2012-04-21 LAB — T-HELPER CELL (CD4) - (RCID CLINIC ONLY)
CD4 % Helper T Cell: 42 % (ref 33–55)
CD4 T Cell Abs: 810 uL (ref 400–2700)

## 2012-04-21 LAB — HIV-1 RNA QUANT-NO REFLEX-BLD: HIV 1 RNA Quant: <20 copies/mL (ref <20)

## 2012-05-31 ENCOUNTER — Ambulatory Visit: Payer: Self-pay | Admitting: Infectious Disease

## 2012-06-01 ENCOUNTER — Ambulatory Visit: Payer: Self-pay | Admitting: Infectious Disease

## 2012-06-09 ENCOUNTER — Ambulatory Visit: Payer: Self-pay | Admitting: Infectious Disease

## 2012-06-14 ENCOUNTER — Ambulatory Visit (INDEPENDENT_AMBULATORY_CARE_PROVIDER_SITE_OTHER): Payer: Self-pay | Admitting: Infectious Disease

## 2012-06-14 ENCOUNTER — Encounter: Payer: Self-pay | Admitting: Infectious Disease

## 2012-06-14 VITALS — BP 146/84 | HR 59 | Ht 69.0 in | Wt 187.0 lb

## 2012-06-14 DIAGNOSIS — I10 Essential (primary) hypertension: Secondary | ICD-10-CM

## 2012-06-14 DIAGNOSIS — B20 Human immunodeficiency virus [HIV] disease: Secondary | ICD-10-CM

## 2012-06-14 MED ORDER — DOLUTEGRAVIR SODIUM 50 MG PO TABS: 50.0000 mg | ORAL_TABLET | Freq: Every day | ORAL | Status: DC

## 2012-06-14 NOTE — Patient Instructions (Addendum)
Your new regimen is:  Tivicay 50mg  daily  With   Truvada daily

## 2012-06-14 NOTE — Progress Notes (Signed)
  Subjective:    Patient ID: [name] [name], male    DOB: Jul 29, 1970, 42 y.o.   MRN: [id number]  HPI  42 y.o. male who is doing superbly well on his antiviral regimen, isentress and truvada with undetectable viral load and health cd4 count. He is interested in new regimens specifically re once daily regimens. After further discussion we decided to change him to [place]. His bp is still a bit elevated and I have asked him to check ambulatory bp readings and send to [place].    Review of Systems  Constitutional: Negative for fever, chills, diaphoresis, activity change, appetite change, fatigue and unexpected weight change.  HENT: Negative for congestion, sore throat, rhinorrhea, sneezing, trouble swallowing and sinus pressure.   Eyes: Negative for photophobia and visual disturbance.  Respiratory: Negative for cough, chest tightness, shortness of breath, wheezing and stridor.   Cardiovascular: Negative for chest pain, palpitations and leg swelling.  Gastrointestinal: Negative for nausea, vomiting, abdominal pain, diarrhea, constipation, blood in stool, abdominal distention and anal bleeding.  Genitourinary: Negative for dysuria, hematuria, flank pain and difficulty urinating.  Musculoskeletal: Negative for myalgias, back pain, joint swelling, arthralgias and gait problem.  Skin: Negative for color change, pallor, rash and wound.  Neurological: Negative for dizziness, tremors, weakness and light-headedness.  Hematological: Negative for adenopathy. Does not bruise/bleed easily.  Psychiatric/Behavioral: Negative for behavioral problems, confusion, sleep disturbance, dysphoric mood, decreased concentration and agitation.       Objective:   Physical Exam  Constitutional: He is oriented to person, place, and time. He appears well-developed and well-nourished. No distress.  HENT:  Head: Normocephalic and atraumatic.  Mouth/Throat: Oropharynx is clear and moist. No oropharyngeal exudate.    Eyes: Conjunctivae normal and EOM are normal. Pupils are equal, round, and reactive to light. No scleral icterus.  Neck: Normal range of motion. Neck supple. No JVD present.  Cardiovascular: Normal rate, regular rhythm and normal heart sounds.  Exam reveals no gallop and no friction rub.   No murmur heard. Pulmonary/Chest: Effort normal and breath sounds normal. No respiratory distress. He has no wheezes. He has no rales. He exhibits no tenderness.  Abdominal: He exhibits no distension and no mass. There is no tenderness. There is no rebound and no guarding.  Musculoskeletal: He exhibits no edema and no tenderness.  Lymphadenopathy:    He has no cervical adenopathy.  Neurological: He is alert and oriented to person, place, and time. He has normal reflexes. He exhibits normal muscle tone. Coordination normal.  Skin: Skin is warm and dry. He is not diaphoretic. No erythema. No pallor.  Psychiatric: He has a normal mood and affect. His behavior is normal. Judgment and thought content normal.          Assessment & Plan:  HIV: switch to Tivicay and Truvada  HTN: check ambulatory bp may need further titration of meds

## 2012-06-29 ENCOUNTER — Other Ambulatory Visit: Payer: Self-pay | Admitting: Licensed Clinical Social Worker

## 2012-06-29 DIAGNOSIS — B20 Human immunodeficiency virus [HIV] disease: Secondary | ICD-10-CM

## 2012-06-29 MED ORDER — DOLUTEGRAVIR SODIUM 50 MG PO TABS: 50.0000 mg | ORAL_TABLET | Freq: Every day | ORAL | Status: DC

## 2012-08-16 ENCOUNTER — Ambulatory Visit: Payer: Self-pay

## 2012-08-16 ENCOUNTER — Other Ambulatory Visit: Payer: Self-pay | Admitting: *Deleted

## 2012-08-16 DIAGNOSIS — B20 Human immunodeficiency virus [HIV] disease: Secondary | ICD-10-CM

## 2012-08-16 MED ORDER — EMTRICITABINE-TENOFOVIR DF 200-300 MG PO TABS: 1.0000 | ORAL_TABLET | Freq: Every day | ORAL | Status: DC

## 2012-09-16 ENCOUNTER — Encounter: Payer: Self-pay | Admitting: Infectious Disease

## 2012-09-16 ENCOUNTER — Ambulatory Visit (INDEPENDENT_AMBULATORY_CARE_PROVIDER_SITE_OTHER): Payer: Self-pay | Admitting: Infectious Disease

## 2012-09-16 VITALS — BP 159/93 | HR 62 | Temp 98.3°F | Wt 197.0 lb

## 2012-09-16 DIAGNOSIS — B20 Human immunodeficiency virus [HIV] disease: Secondary | ICD-10-CM

## 2012-09-16 DIAGNOSIS — I10 Essential (primary) hypertension: Secondary | ICD-10-CM

## 2012-09-16 LAB — COMPLETE METABOLIC PANEL WITH GFR
ALT: 21 U/L (ref 0–53)
AST: 16 U/L (ref 0–37)
CO2: 29 mEq/L (ref 19–32)
Chloride: 103 mEq/L (ref 96–112)
Creat: 1.36 mg/dL — ABNORMAL HIGH (ref 0.50–1.35)
GFR, Est African American: 74 mL/min
Sodium: 139 mEq/L (ref 135–145)
Total Bilirubin: 0.4 mg/dL (ref 0.3–1.2)
Total Protein: 7 g/dL (ref 6.0–8.3)

## 2012-09-16 LAB — CBC WITH DIFFERENTIAL/PLATELET
Basophils Absolute: 0 10*3/uL (ref 0.0–0.1)
Eosinophils Absolute: 0.2 10*3/uL (ref 0.0–0.7)
Lymphocytes Relative: 34 % (ref 12–46)
Lymphs Abs: 2.6 10*3/uL (ref 0.7–4.0)
Neutrophils Relative %: 57 % (ref 43–77)
Platelets: 213 10*3/uL (ref 150–400)
RBC: 5.62 MIL/uL (ref 4.22–5.81)
RDW: 17.4 % — ABNORMAL HIGH (ref 11.5–15.5)
WBC: 7.7 10*3/uL (ref 4.0–10.5)

## 2012-09-16 NOTE — Progress Notes (Signed)
  Subjective:    Patient ID: [name] [name], male    DOB: [birth year], 42 y.o.   MRN: [id number]  HPI   42 y.o. male who is doing superbly well on his antiviral regimen, isentress and truvada with undetectable viral load and health cd4 count. He is interested in new regimens specifically re once daily regimens. After further discussion we changed him to [place]. He never had repeat labs but he has missed NONE of his meds. His bp is still a bit elevated and he is not taking his ACE I. I emphasized the utmost importance of BP control to prevent strokes and renal failure dialysis etc and he says he will start takign again. He is also taking krill oil omega 3 and vitamin c and d and brought to double check for drug drug ineracitons which there are none. Otherwise no complaints.  I spent greater than 45 minutes with the patient including greater than 50% of time in face to face counsel of the patient and in coordination of their care.   Review of Systems  Constitutional: Negative for fever, chills, diaphoresis, activity change, appetite change, fatigue and unexpected weight change.  HENT: Negative for congestion, sore throat, rhinorrhea, sneezing, trouble swallowing and sinus pressure.   Eyes: Negative for photophobia and visual disturbance.  Respiratory: Negative for cough, chest tightness, shortness of breath, wheezing and stridor.   Cardiovascular: Negative for chest pain, palpitations and leg swelling.  Gastrointestinal: Negative for nausea, vomiting, abdominal pain, diarrhea, constipation, blood in stool, abdominal distention and anal bleeding.  Genitourinary: Negative for dysuria, hematuria, flank pain and difficulty urinating.  Musculoskeletal: Negative for myalgias, back pain, joint swelling, arthralgias and gait problem.  Skin: Negative for color change, pallor, rash and wound.  Neurological: Negative for dizziness, tremors, weakness and light-headedness.  Hematological: Negative  for adenopathy. Does not bruise/bleed easily.  Psychiatric/Behavioral: Negative for behavioral problems, confusion, sleep disturbance, dysphoric mood, decreased concentration and agitation.       Objective:   Physical Exam  Constitutional: He is oriented to person, place, and time. He appears well-developed and well-nourished. No distress.  HENT:  Head: Normocephalic and atraumatic.  Mouth/Throat: Oropharynx is clear and moist. No oropharyngeal exudate.  Eyes: Conjunctivae and EOM are normal. Pupils are equal, round, and reactive to light. No scleral icterus.  Neck: Normal range of motion. Neck supple. No JVD present.  Cardiovascular: Normal rate, regular rhythm and normal heart sounds.  Exam reveals no gallop and no friction rub.   No murmur heard. Pulmonary/Chest: Effort normal and breath sounds normal. No respiratory distress. He has no wheezes. He has no rales. He exhibits no tenderness.  Abdominal: He exhibits no distension and no mass. There is no tenderness. There is no rebound and no guarding.  Musculoskeletal: He exhibits no edema and no tenderness.  Lymphadenopathy:    He has no cervical adenopathy.  Neurological: He is alert and oriented to person, place, and time. He has normal reflexes. He exhibits normal muscle tone. Coordination normal.  Skin: Skin is warm and dry. He is not diaphoretic. No erythema. No pallor.  Psychiatric: He has a normal mood and affect. His behavior is normal. Judgment and thought content normal.          Assessment & Plan:  HIV: continue Tivicay and Truvada, check VL CD4 labs  HTN: restart ACEI, check microalbumin creatinine ratio

## 2012-09-17 LAB — T-HELPER CELL (CD4) - (RCID CLINIC ONLY)
CD4 % Helper T Cell: 42 % (ref 33–55)
CD4 T Cell Abs: 1100 uL (ref 400–2700)

## 2012-09-17 LAB — MICROALBUMIN / CREATININE URINE RATIO
Creatinine, Urine: 221.2 mg/dL
Microalb Creat Ratio: 2.3 mg/g (ref 0.0–30.0)

## 2012-09-19 LAB — HIV-1 RNA QUANT-NO REFLEX-BLD: HIV 1 RNA Quant: <20 copies/mL (ref <20)

## 2012-10-19 ENCOUNTER — Other Ambulatory Visit: Payer: Self-pay

## 2012-10-19 ENCOUNTER — Telehealth: Payer: Self-pay | Admitting: Licensed Clinical Social Worker

## 2012-10-19 DIAGNOSIS — N485 Ulcer of penis: Secondary | ICD-10-CM

## 2012-10-19 NOTE — Telephone Encounter (Signed)
Patient called with complaints of a new blister on his penis that is sore and looks to be fluid filled. He called the health department and they could not see him until tomorrow. He is nervous about it and wanted to be seen. I advised him that we did not have any openings this afternoon but I instructed him to come by and have some std tests done.

## 2012-10-20 ENCOUNTER — Other Ambulatory Visit: Payer: Self-pay | Admitting: Licensed Clinical Social Worker

## 2012-10-20 ENCOUNTER — Ambulatory Visit: Payer: Self-pay | Admitting: Licensed Clinical Social Worker

## 2012-10-20 DIAGNOSIS — B029 Zoster without complications: Secondary | ICD-10-CM

## 2012-10-20 DIAGNOSIS — N485 Ulcer of penis: Secondary | ICD-10-CM

## 2012-10-20 LAB — RPR

## 2012-10-20 MED ORDER — VALACYCLOVIR HCL 1 G PO TABS: 1000.0000 mg | ORAL_TABLET | Freq: Two times a day (BID) | ORAL | Status: DC

## 2012-10-22 LAB — HERPES SIMPLEX VIRUS CULTURE: Organism ID, Bacteria: DETECTED

## 2012-11-29 ENCOUNTER — Other Ambulatory Visit: Payer: Self-pay | Admitting: *Deleted

## 2012-11-29 DIAGNOSIS — B029 Zoster without complications: Secondary | ICD-10-CM

## 2012-11-29 MED ORDER — VALACYCLOVIR HCL 1 G PO TABS: 1000.0000 mg | ORAL_TABLET | Freq: Two times a day (BID) | ORAL | Status: DC

## 2013-01-04 ENCOUNTER — Other Ambulatory Visit (INDEPENDENT_AMBULATORY_CARE_PROVIDER_SITE_OTHER): Payer: Self-pay

## 2013-01-04 DIAGNOSIS — Z113 Encounter for screening for infections with a predominantly sexual mode of transmission: Secondary | ICD-10-CM

## 2013-01-04 DIAGNOSIS — B20 Human immunodeficiency virus [HIV] disease: Secondary | ICD-10-CM

## 2013-01-04 DIAGNOSIS — Z79899 Other long term (current) drug therapy: Secondary | ICD-10-CM

## 2013-01-05 LAB — CBC WITH DIFFERENTIAL/PLATELET
Basophils Absolute: 0 10*3/uL (ref 0.0–0.1)
HCT: 38.2 % — ABNORMAL LOW (ref 39.0–52.0)
Hemoglobin: 12.4 g/dL — ABNORMAL LOW (ref 13.0–17.0)
Lymphocytes Relative: 41 % (ref 12–46)
Lymphs Abs: 3.1 10*3/uL (ref 0.7–4.0)
MCV: 67.7 fL — ABNORMAL LOW (ref 78.0–100.0)
Monocytes Absolute: 0.4 10*3/uL (ref 0.1–1.0)
Neutro Abs: 3.9 10*3/uL (ref 1.7–7.7)
RBC: 5.64 MIL/uL (ref 4.22–5.81)
RDW: 16.8 % — ABNORMAL HIGH (ref 11.5–15.5)
WBC: 7.6 10*3/uL (ref 4.0–10.5)

## 2013-01-05 LAB — COMPLETE METABOLIC PANEL WITH GFR
ALT: 23 U/L (ref 0–53)
Albumin: 4.6 g/dL (ref 3.5–5.2)
Alkaline Phosphatase: 80 U/L (ref 39–117)
CO2: 26 mEq/L (ref 19–32)
GFR, Est African American: 81 mL/min
Potassium: 4.2 mEq/L (ref 3.5–5.3)
Sodium: 139 mEq/L (ref 135–145)
Total Bilirubin: 0.4 mg/dL (ref 0.3–1.2)
Total Protein: 7.5 g/dL (ref 6.0–8.3)

## 2013-01-05 LAB — LIPID PANEL
LDL Cholesterol: 91 mg/dL (ref 0–99)
VLDL: 25 mg/dL (ref 0–40)

## 2013-01-05 LAB — HIV-1 RNA QUANT-NO REFLEX-BLD: HIV-1 RNA Quant, Log: <1.3 {Log} (ref <1.30)

## 2013-01-05 LAB — T-HELPER CELL (CD4) - (RCID CLINIC ONLY): CD4 T Cell Abs: 1320 uL (ref 400–2700)

## 2013-01-18 ENCOUNTER — Ambulatory Visit: Payer: Self-pay

## 2013-01-18 ENCOUNTER — Ambulatory Visit (INDEPENDENT_AMBULATORY_CARE_PROVIDER_SITE_OTHER): Payer: Self-pay | Admitting: Infectious Disease

## 2013-01-18 ENCOUNTER — Encounter: Payer: Self-pay | Admitting: Infectious Disease

## 2013-01-18 VITALS — BP 135/84 | HR 62 | Temp 98.4°F | Wt 195.0 lb

## 2013-01-18 DIAGNOSIS — I10 Essential (primary) hypertension: Secondary | ICD-10-CM

## 2013-01-18 DIAGNOSIS — Z23 Encounter for immunization: Secondary | ICD-10-CM

## 2013-01-18 DIAGNOSIS — Z201 Contact with and (suspected) exposure to tuberculosis: Secondary | ICD-10-CM

## 2013-01-18 DIAGNOSIS — B20 Human immunodeficiency virus [HIV] disease: Secondary | ICD-10-CM

## 2013-01-18 NOTE — Progress Notes (Signed)
  Subjective:    Patient ID: [name] [name], male    DOB: 09-11-1970, 42 y.o.   MRN: [id number]  HPI   42 y.o. male who had been doing superbly well on his antiviral regimen, isentress and truvada with undetectable viral load and health cd4 count which we then changed him to [place] where he has also maintained perfect virological suppression.    His former roommate was rx for pulmonary TB but he himself was never tested fro LTB. He has no pulm symptoms no cough no weight loss.   Review of Systems  Constitutional: Negative for fever, chills, diaphoresis, activity change, appetite change, fatigue and unexpected weight change.  HENT: Negative for congestion, sore throat, rhinorrhea, sneezing, trouble swallowing and sinus pressure.   Eyes: Negative for photophobia and visual disturbance.  Respiratory: Negative for cough, chest tightness, shortness of breath, wheezing and stridor.   Cardiovascular: Negative for chest pain, palpitations and leg swelling.  Gastrointestinal: Negative for nausea, vomiting, abdominal pain, diarrhea, constipation, blood in stool, abdominal distention and anal bleeding.  Genitourinary: Negative for dysuria, hematuria, flank pain and difficulty urinating.  Musculoskeletal: Negative for myalgias, back pain, joint swelling, arthralgias and gait problem.  Skin: Negative for color change, pallor, rash and wound.  Neurological: Negative for dizziness, tremors, weakness and light-headedness.  Hematological: Negative for adenopathy. Does not bruise/bleed easily.  Psychiatric/Behavioral: Negative for behavioral problems, confusion, sleep disturbance, dysphoric mood, decreased concentration and agitation.       Objective:   Physical Exam  Constitutional: He is oriented to person, place, and time. He appears well-developed and well-nourished. No distress.  HENT:  Head: Normocephalic and atraumatic.  Mouth/Throat: Oropharynx is clear and moist. No oropharyngeal  exudate.  Eyes: Conjunctivae and EOM are normal. Pupils are equal, round, and reactive to light. No scleral icterus.  Neck: Normal range of motion. Neck supple. No JVD present.  Cardiovascular: Normal rate, regular rhythm and normal heart sounds.  Exam reveals no gallop and no friction rub.   No murmur heard. Pulmonary/Chest: Effort normal and breath sounds normal. No respiratory distress. He has no wheezes. He has no rales. He exhibits no tenderness.  Abdominal: He exhibits no distension and no mass. There is no tenderness. There is no rebound and no guarding.  Musculoskeletal: He exhibits no edema and no tenderness.  Lymphadenopathy:    He has no cervical adenopathy.  Neurological: He is alert and oriented to person, place, and time. He has normal reflexes. He exhibits normal muscle tone. Coordination normal.  Skin: Skin is warm and dry. He is not diaphoretic. No erythema. No pallor.  Psychiatric: He has a normal mood and affect. His behavior is normal. Judgment and thought content normal.          Assessment & Plan:  HIV: continue Tivicay and Truvada, check VL CD4 labs, consider change to Triumeq when ADAP approves and if hla [id number] negative  HTN: continue ACEI,   Exposure to TB: check QF gold and CXR

## 2013-01-19 ENCOUNTER — Ambulatory Visit (HOSPITAL_COMMUNITY)
Admission: RE | Admit: 2013-01-19 | Discharge: 2013-01-19 | Disposition: A | Discharge status: Home / Self Care | Payer: Self-pay | Source: Ambulatory Visit | Attending: Infectious Disease | Admitting: Infectious Disease

## 2013-01-19 DIAGNOSIS — Z201 Contact with and (suspected) exposure to tuberculosis: Secondary | ICD-10-CM | POA: Insufficient documentation

## 2013-01-19 DIAGNOSIS — Z21 Asymptomatic human immunodeficiency virus [HIV] infection status: Secondary | ICD-10-CM | POA: Insufficient documentation

## 2013-01-19 DIAGNOSIS — B20 Human immunodeficiency virus [HIV] disease: Secondary | ICD-10-CM

## 2013-01-20 ENCOUNTER — Telehealth: Payer: Self-pay | Admitting: *Deleted

## 2013-01-20 LAB — QUANTIFERON TB GOLD ASSAY (BLOOD): Mitogen value: 7.92 IU/mL

## 2013-01-20 NOTE — Telephone Encounter (Signed)
Patient called to get a result for his chest xray from 01/19/13. Advised him is was negative and he can relax as the patient was very nervous about possible contact with infected person.

## 2013-01-26 ENCOUNTER — Telehealth: Payer: Self-pay | Admitting: *Deleted

## 2013-01-26 DIAGNOSIS — A6002 Herpesviral infection of other male genital organs: Secondary | ICD-10-CM

## 2013-01-26 LAB — HLA B*5701: HLA-B*5701: NEGATIVE

## 2013-01-26 NOTE — Telephone Encounter (Signed)
MD please advise about Valtrex rx.  Does the pt need to be on this medication continuously?  Dose? Frequency? # of Tablets?  # of Refills?

## 2013-01-26 NOTE — Telephone Encounter (Signed)
He can be on valtrex 500mg  daily for prevention

## 2013-01-27 MED ORDER — VALACYCLOVIR HCL 1 G PO TABS: 500.0000 mg | ORAL_TABLET | Freq: Every day | ORAL | Status: DC

## 2013-01-27 NOTE — Addendum Note (Signed)
Addended by: Jennet Maduro D on: 01/27/2013 08:59 AM   Modules accepted: Orders

## 2013-01-27 NOTE — Telephone Encounter (Signed)
Informed pt that he will be taking Valtrex daily for suppressive therapy.  Described suppressive therapy and why is was important.  Pt verbalized understanding.

## 2013-03-09 ENCOUNTER — Other Ambulatory Visit: Payer: Self-pay | Admitting: Licensed Clinical Social Worker

## 2013-03-09 DIAGNOSIS — IMO0002 Reserved for concepts with insufficient information to code with codable children: Secondary | ICD-10-CM

## 2013-03-09 DIAGNOSIS — B20 Human immunodeficiency virus [HIV] disease: Secondary | ICD-10-CM

## 2013-03-09 MED ORDER — EMTRICITABINE-TENOFOVIR DF 200-300 MG PO TABS: 1.0000 | ORAL_TABLET | Freq: Every day | ORAL | Status: DC

## 2013-03-09 MED ORDER — DOLUTEGRAVIR SODIUM 50 MG PO TABS: 50.0000 mg | ORAL_TABLET | Freq: Every day | ORAL | Status: DC

## 2013-03-24 ENCOUNTER — Other Ambulatory Visit: Payer: Self-pay | Admitting: *Deleted

## 2013-03-24 DIAGNOSIS — IMO0002 Reserved for concepts with insufficient information to code with codable children: Secondary | ICD-10-CM

## 2013-03-24 MED ORDER — LISINOPRIL 20 MG PO TABS: 20.0000 mg | ORAL_TABLET | Freq: Every day | ORAL | Status: DC

## 2013-04-11 ENCOUNTER — Telehealth: Payer: Self-pay | Admitting: Licensed Clinical Social Worker

## 2013-04-11 ENCOUNTER — Telehealth: Payer: Self-pay | Admitting: Infectious Disease

## 2013-04-11 MED ORDER — ABACAVIR-DOLUTEGRAVIR-LAMIVUD 600-50-300 MG PO TABS: 1.0000 | ORAL_TABLET | Freq: Every day | ORAL | Status: DC

## 2013-04-11 NOTE — Telephone Encounter (Signed)
Patient called wanting to know if he could start on Triumeq, his HLA B [id number] was negative. Is it ok to call in?

## 2013-04-11 NOTE — Telephone Encounter (Signed)
Yes that is perfectly fine thanks Tamika!

## 2013-04-11 NOTE — Telephone Encounter (Signed)
I wrote script to change to Triumeq

## 2013-05-23 ENCOUNTER — Ambulatory Visit: Payer: Self-pay

## 2013-05-23 ENCOUNTER — Ambulatory Visit (INDEPENDENT_AMBULATORY_CARE_PROVIDER_SITE_OTHER): Payer: Self-pay | Admitting: Infectious Disease

## 2013-05-23 ENCOUNTER — Encounter: Payer: Self-pay | Admitting: Infectious Disease

## 2013-05-23 VITALS — BP 151/95 | HR 66 | Temp 97.9°F | Wt 197.0 lb

## 2013-05-23 DIAGNOSIS — I10 Essential (primary) hypertension: Secondary | ICD-10-CM

## 2013-05-23 DIAGNOSIS — B20 Human immunodeficiency virus [HIV] disease: Secondary | ICD-10-CM

## 2013-05-23 DIAGNOSIS — Z113 Encounter for screening for infections with a predominantly sexual mode of transmission: Secondary | ICD-10-CM

## 2013-05-23 DIAGNOSIS — M79609 Pain in unspecified limb: Secondary | ICD-10-CM

## 2013-05-23 DIAGNOSIS — M79605 Pain in left leg: Secondary | ICD-10-CM | POA: Insufficient documentation

## 2013-05-23 LAB — COMPLETE METABOLIC PANEL WITH GFR
ALK PHOS: 84 U/L (ref 39–117)
ALT: 21 U/L (ref 0–53)
AST: 17 U/L (ref 0–37)
Albumin: 4.3 g/dL (ref 3.5–5.2)
BILIRUBIN TOTAL: 0.3 mg/dL (ref 0.3–1.2)
BUN: 16 mg/dL (ref 6–23)
CO2: 28 meq/L (ref 19–32)
CREATININE: 1.3 mg/dL (ref 0.50–1.35)
Calcium: 9.8 mg/dL (ref 8.4–10.5)
Chloride: 101 mEq/L (ref 96–112)
GFR, EST AFRICAN AMERICAN: 77 mL/min
GFR, EST NON AFRICAN AMERICAN: 67 mL/min
GLUCOSE: 93 mg/dL (ref 70–99)
Potassium: 4.1 mEq/L (ref 3.5–5.3)
Sodium: 139 mEq/L (ref 135–145)
Total Protein: 7.1 g/dL (ref 6.0–8.3)

## 2013-05-23 LAB — RPR

## 2013-05-23 LAB — CBC WITH DIFFERENTIAL/PLATELET
Basophils Absolute: 0 10*3/uL (ref 0.0–0.1)
Basophils Relative: 0 % (ref 0–1)
Eosinophils Absolute: 0.1 10*3/uL (ref 0.0–0.7)
Eosinophils Relative: 1 % (ref 0–5)
HEMATOCRIT: 38.1 % — AB (ref 39.0–52.0)
HEMOGLOBIN: 12.2 g/dL — AB (ref 13.0–17.0)
LYMPHS ABS: 2.9 10*3/uL (ref 0.7–4.0)
LYMPHS PCT: 37 % (ref 12–46)
MCH: 22 pg — ABNORMAL LOW (ref 26.0–34.0)
MCHC: 32 g/dL (ref 30.0–36.0)
MCV: 68.6 fL — ABNORMAL LOW (ref 78.0–100.0)
MONOS PCT: 6 % (ref 3–12)
Monocytes Absolute: 0.4 10*3/uL (ref 0.1–1.0)
NEUTROS ABS: 4.3 10*3/uL (ref 1.7–7.7)
NEUTROS PCT: 56 % (ref 43–77)
Platelets: 235 10*3/uL (ref 150–400)
RBC: 5.55 MIL/uL (ref 4.22–5.81)
RDW: 16.5 % — ABNORMAL HIGH (ref 11.5–15.5)
WBC: 7.7 10*3/uL (ref 4.0–10.5)

## 2013-05-23 MED ORDER — LISINOPRIL-HYDROCHLOROTHIAZIDE 20-12.5 MG PO TABS: 1.0000 | ORAL_TABLET | Freq: Every day | ORAL | Status: DC

## 2013-05-23 NOTE — Progress Notes (Signed)
  Subjective:    Patient ID: [name] [name], male    DOB: Nov 29, 1970, 43 y.o.   MRN: [id number]  HPI   42.o. male who had been doing superbly well on his antiviral regimen TRIUMEQ but not had repeat labs yet  BP still is poorly controlled.   He c/o pain behind left knee x several weeks. ? Any preciptaiting fctor. Better with rest, NSAIDS.   Review of Systems  Constitutional: Negative for fever, chills, diaphoresis, activity change, appetite change, fatigue and unexpected weight change.  HENT: Negative for congestion, rhinorrhea, sinus pressure, sneezing, sore throat and trouble swallowing.   Eyes: Negative for photophobia and visual disturbance.  Respiratory: Negative for cough, chest tightness, shortness of breath, wheezing and stridor.   Cardiovascular: Negative for chest pain, palpitations and leg swelling.  Gastrointestinal: Negative for nausea, vomiting, abdominal pain, diarrhea, constipation, blood in stool, abdominal distention and anal bleeding.  Genitourinary: Negative for dysuria, hematuria, flank pain and difficulty urinating.  Musculoskeletal: Negative for arthralgias, back pain, gait problem, joint swelling and myalgias.  Skin: Negative for color change, pallor, rash and wound.  Neurological: Negative for dizziness, tremors, weakness and light-headedness.  Hematological: Negative for adenopathy. Does not bruise/bleed easily.  Psychiatric/Behavioral: Negative for behavioral problems, confusion, sleep disturbance, dysphoric mood, decreased concentration and agitation.       Objective:   Physical Exam  Constitutional: He is oriented to person, place, and time. He appears well-developed and well-nourished. No distress.  HENT:  Head: Normocephalic and atraumatic.  Mouth/Throat: Oropharynx is clear and moist. No oropharyngeal exudate.  Eyes: Conjunctivae and EOM are normal. Pupils are equal, round, and reactive to light. No scleral icterus.  Neck: Normal range of motion.  Neck supple. No JVD present.  Cardiovascular: Normal rate, regular rhythm and normal heart sounds.  Exam reveals no gallop and no friction rub.   No murmur heard. Pulmonary/Chest: Effort normal and breath sounds normal. No respiratory distress. He has no wheezes. He has no rales. He exhibits no tenderness.  Abdominal: He exhibits no distension and no mass. There is no tenderness. There is no rebound and no guarding.  Musculoskeletal: He exhibits no edema and no tenderness.  Lymphadenopathy:    He has no cervical adenopathy.  Neurological: He is alert and oriented to person, place, and time. He has normal reflexes. He exhibits normal muscle tone. Coordination normal.  Skin: Skin is warm and dry. He is not diaphoretic. No erythema. No pallor.  Psychiatric: He has a normal mood and affect. His behavior is normal. Judgment and thought content normal.          Assessment & Plan:  HIV: continue Triumeq  And check labs today, re-enroll in ADAP I spent greater than 25 minutes with the patient including greater than 50% of time in face to face counsel of the patient and in coordination of their care.   HTN: continue ACEI but combine with HCTZ Single tablet  Pain behind left leg: likely a Bakers cyst but will get doppler

## 2013-05-26 LAB — T-HELPER CELL (CD4) - (RCID CLINIC ONLY)
CD4 T CELL ABS: 1380 /uL (ref 400–2700)
CD4 T CELL HELPER: 46 % (ref 33–55)

## 2013-05-26 LAB — HIV-1 RNA QUANT-NO REFLEX-BLD: HIV 1 RNA Quant: <20 copies/mL (ref <20)

## 2013-05-30 ENCOUNTER — Other Ambulatory Visit: Payer: Self-pay | Admitting: *Deleted

## 2013-05-30 DIAGNOSIS — B20 Human immunodeficiency virus [HIV] disease: Secondary | ICD-10-CM

## 2013-05-30 MED ORDER — ABACAVIR-DOLUTEGRAVIR-LAMIVUD 600-50-300 MG PO TABS: 1.0000 | ORAL_TABLET | Freq: Every day | ORAL | Status: DC

## 2013-05-30 NOTE — Telephone Encounter (Signed)
ADAP Application 

## 2013-07-04 ENCOUNTER — Other Ambulatory Visit: Payer: Self-pay | Admitting: *Deleted

## 2013-07-04 DIAGNOSIS — B20 Human immunodeficiency virus [HIV] disease: Secondary | ICD-10-CM

## 2013-07-04 MED ORDER — ABACAVIR-DOLUTEGRAVIR-LAMIVUD 600-50-300 MG PO TABS: 1.0000 | ORAL_TABLET | Freq: Every day | ORAL | Status: DC

## 2013-09-01 ENCOUNTER — Telehealth: Payer: Self-pay | Admitting: *Deleted

## 2013-09-01 NOTE — Telephone Encounter (Signed)
Called [employer].  [name] [name] was approved for the $4000.00 grant effective until 09-01-14.

## 2013-09-22 ENCOUNTER — Other Ambulatory Visit: Payer: BC Managed Care – PPO

## 2013-09-23 ENCOUNTER — Other Ambulatory Visit: Payer: BC Managed Care – PPO

## 2013-09-26 ENCOUNTER — Other Ambulatory Visit (INDEPENDENT_AMBULATORY_CARE_PROVIDER_SITE_OTHER): Payer: BC Managed Care – PPO

## 2013-09-26 DIAGNOSIS — Z113 Encounter for screening for infections with a predominantly sexual mode of transmission: Secondary | ICD-10-CM

## 2013-09-26 DIAGNOSIS — B20 Human immunodeficiency virus [HIV] disease: Secondary | ICD-10-CM

## 2013-09-26 DIAGNOSIS — Z79899 Other long term (current) drug therapy: Secondary | ICD-10-CM

## 2013-09-26 LAB — CBC WITH DIFFERENTIAL/PLATELET
BASOS ABS: 0 10*3/uL (ref 0.0–0.1)
BASOS PCT: 0 % (ref 0–1)
Eosinophils Absolute: 0.1 10*3/uL (ref 0.0–0.7)
Eosinophils Relative: 2 % (ref 0–5)
HEMATOCRIT: 37.7 % — AB (ref 39.0–52.0)
Hemoglobin: 12.5 g/dL — ABNORMAL LOW (ref 13.0–17.0)
Lymphocytes Relative: 44 % (ref 12–46)
Lymphs Abs: 2.4 10*3/uL (ref 0.7–4.0)
MCH: 22.5 pg — ABNORMAL LOW (ref 26.0–34.0)
MCHC: 33.2 g/dL (ref 30.0–36.0)
MCV: 67.8 fL — ABNORMAL LOW (ref 78.0–100.0)
MONO ABS: 0.4 10*3/uL (ref 0.1–1.0)
Monocytes Relative: 8 % (ref 3–12)
NEUTROS ABS: 2.5 10*3/uL (ref 1.7–7.7)
Neutrophils Relative %: 46 % (ref 43–77)
PLATELETS: 188 10*3/uL (ref 150–400)
RBC: 5.56 MIL/uL (ref 4.22–5.81)
RDW: 16.7 % — AB (ref 11.5–15.5)
WBC: 5.4 10*3/uL (ref 4.0–10.5)

## 2013-09-26 LAB — COMPLETE METABOLIC PANEL WITH GFR
ALBUMIN: 4.3 g/dL (ref 3.5–5.2)
ALK PHOS: 94 U/L (ref 39–117)
ALT: 187 U/L — ABNORMAL HIGH (ref 0–53)
AST: 83 U/L — AB (ref 0–37)
BUN: 15 mg/dL (ref 6–23)
CO2: 27 mEq/L (ref 19–32)
CREATININE: 1.11 mg/dL (ref 0.50–1.35)
Calcium: 9.9 mg/dL (ref 8.4–10.5)
Chloride: 99 mEq/L (ref 96–112)
GFR, Est African American: >89 mL/min
GFR, Est Non African American: 81 mL/min
Glucose, Bld: 126 mg/dL — ABNORMAL HIGH (ref 70–99)
POTASSIUM: 4.5 meq/L (ref 3.5–5.3)
Sodium: 134 mEq/L — ABNORMAL LOW (ref 135–145)
Total Bilirubin: 0.3 mg/dL (ref 0.2–1.2)
Total Protein: 6.8 g/dL (ref 6.0–8.3)

## 2013-09-26 LAB — LIPID PANEL
CHOL/HDL RATIO: 2.8 ratio
CHOLESTEROL: 128 mg/dL (ref 0–200)
HDL: 45 mg/dL (ref >39)
LDL Cholesterol: 64 mg/dL (ref 0–99)
TRIGLYCERIDES: 95 mg/dL (ref <150)
VLDL: 19 mg/dL (ref 0–40)

## 2013-09-26 LAB — RPR

## 2013-09-27 LAB — T-HELPER CELL (CD4) - (RCID CLINIC ONLY)
CD4 % Helper T Cell: 45 % (ref 33–55)
CD4 T Cell Abs: 1060 /uL (ref 400–2700)

## 2013-09-28 LAB — HIV-1 RNA QUANT-NO REFLEX-BLD

## 2013-10-05 ENCOUNTER — Encounter: Payer: Self-pay | Admitting: Infectious Disease

## 2013-10-05 ENCOUNTER — Ambulatory Visit (INDEPENDENT_AMBULATORY_CARE_PROVIDER_SITE_OTHER): Payer: BC Managed Care – PPO | Admitting: Infectious Disease

## 2013-10-05 ENCOUNTER — Other Ambulatory Visit: Payer: Self-pay | Admitting: *Deleted

## 2013-10-05 VITALS — BP 116/78 | HR 74 | Temp 98.1°F | Ht 68.5 in | Wt 192.0 lb

## 2013-10-05 DIAGNOSIS — B20 Human immunodeficiency virus [HIV] disease: Secondary | ICD-10-CM

## 2013-10-05 DIAGNOSIS — I10 Essential (primary) hypertension: Secondary | ICD-10-CM

## 2013-10-05 DIAGNOSIS — Z113 Encounter for screening for infections with a predominantly sexual mode of transmission: Secondary | ICD-10-CM

## 2013-10-05 MED ORDER — ABACAVIR-DOLUTEGRAVIR-LAMIVUD 600-50-300 MG PO TABS: 1.0000 | ORAL_TABLET | Freq: Every day | ORAL | Status: DC

## 2013-10-05 NOTE — Progress Notes (Signed)
  Subjective:    Patient ID: [name] [name], male    DOB: Aug 27, 1970, 43 y.o.   MRN: [id number]  HPI   42.o. male who had been doing superbly well on his antiviral regimen TRIUMEQ  With undetectable VL and very healthy CD4 count  BP is better controlled now he is on medications and being followed by primary care.   Review of Systems  Constitutional: Negative for fever, chills, diaphoresis, activity change, appetite change, fatigue and unexpected weight change.  HENT: Negative for congestion, rhinorrhea, sinus pressure, sneezing, sore throat and trouble swallowing.   Eyes: Negative for photophobia and visual disturbance.  Respiratory: Negative for cough, chest tightness, shortness of breath, wheezing and stridor.   Cardiovascular: Negative for chest pain, palpitations and leg swelling.  Gastrointestinal: Negative for nausea, vomiting, abdominal pain, diarrhea, constipation, blood in stool, abdominal distention and anal bleeding.  Genitourinary: Negative for dysuria, hematuria, flank pain and difficulty urinating.  Musculoskeletal: Negative for arthralgias, back pain, gait problem, joint swelling and myalgias.  Skin: Negative for color change, pallor, rash and wound.  Neurological: Negative for dizziness, tremors, weakness and light-headedness.  Hematological: Negative for adenopathy. Does not bruise/bleed easily.  Psychiatric/Behavioral: Negative for behavioral problems, confusion, sleep disturbance, dysphoric mood, decreased concentration and agitation.       Objective:   Physical Exam  Constitutional: He is oriented to person, place, and time. He appears well-developed and well-nourished. No distress.  HENT:  Head: Normocephalic and atraumatic.  Mouth/Throat: Oropharynx is clear and moist. No oropharyngeal exudate.  Eyes: Conjunctivae and EOM are normal. Pupils are equal, round, and reactive to light. No scleral icterus.  Neck: Normal range of motion. Neck supple. No JVD present.   Cardiovascular: Normal rate, regular rhythm and normal heart sounds.  Exam reveals no gallop and no friction rub.   No murmur heard. Pulmonary/Chest: Effort normal and breath sounds normal. No respiratory distress. He has no wheezes. He has no rales. He exhibits no tenderness.  Abdominal: He exhibits no distension and no mass. There is no tenderness. There is no rebound and no guarding.  Musculoskeletal: He exhibits no edema and no tenderness.  Lymphadenopathy:    He has no cervical adenopathy.  Neurological: He is alert and oriented to person, place, and time. He has normal reflexes. He exhibits normal muscle tone. Coordination normal.  Skin: Skin is warm and dry. He is not diaphoretic. No erythema. No pallor.  Psychiatric: He has a normal mood and affect. His behavior is normal. Judgment and thought content normal.          Assessment & Plan:  HIV: continue Triumeq  Check labs in  6 months. I spent greater than 25 minutes with the patient including greater than 50% of time in face to face counsel of the patient and in coordination of their care.    HTN: continue ACEI but combine with HCTZ Single tablet being followed by Dr Nehemiah SettlePolite  Prevention: FRS is 1% great candidate for REPREIVE study and introduced to [employer] [employer]

## 2013-10-13 ENCOUNTER — Ambulatory Visit (INDEPENDENT_AMBULATORY_CARE_PROVIDER_SITE_OTHER): Payer: Self-pay | Admitting: *Deleted

## 2013-10-13 VITALS — BP 127/83 | HR 52 | Temp 98.3°F | Resp 16 | Ht 69.5 in | Wt 196.2 lb

## 2013-10-13 DIAGNOSIS — Z21 Asymptomatic human immunodeficiency virus [HIV] infection status: Secondary | ICD-10-CM

## 2013-10-13 DIAGNOSIS — B20 Human immunodeficiency virus [HIV] disease: Secondary | ICD-10-CM

## 2013-10-13 LAB — COMPREHENSIVE METABOLIC PANEL
ALT: 337 U/L — ABNORMAL HIGH (ref 0–53)
AST: 124 U/L — ABNORMAL HIGH (ref 0–37)
Albumin: 4.4 g/dL (ref 3.5–5.2)
Alkaline Phosphatase: 77 U/L (ref 39–117)
BUN: 11 mg/dL (ref 6–23)
CALCIUM: 9.3 mg/dL (ref 8.4–10.5)
CHLORIDE: 103 meq/L (ref 96–112)
CO2: 28 meq/L (ref 19–32)
CREATININE: 1.15 mg/dL (ref 0.50–1.35)
Glucose, Bld: 97 mg/dL (ref 70–99)
Potassium: 4.5 mEq/L (ref 3.5–5.3)
Sodium: 138 mEq/L (ref 135–145)
Total Bilirubin: 0.5 mg/dL (ref 0.2–1.2)
Total Protein: 6.6 g/dL (ref 6.0–8.3)

## 2013-10-13 LAB — LIPID PANEL
CHOL/HDL RATIO: 2.6 ratio
CHOLESTEROL: 151 mg/dL (ref 0–200)
HDL: 58 mg/dL (ref >39)
LDL Cholesterol: 79 mg/dL (ref 0–99)
Triglycerides: 72 mg/dL (ref <150)
VLDL: 14 mg/dL (ref 0–40)

## 2013-10-13 NOTE — Progress Notes (Signed)
   Subjective:    Patient ID: [name] [name], male    DOB: [birth year], 43 y.o.   MRN: [id number]  HPI    Review of Systems  Constitutional: Negative.   HENT: Negative.   Eyes: Negative.   Respiratory: Negative.   Cardiovascular: Negative.   Gastrointestinal: Negative.   Genitourinary: Negative.   Musculoskeletal: Negative.   Skin: Negative.   Neurological: Negative.   Psychiatric/Behavioral: Negative.        Objective:   Physical Exam  Constitutional: He is oriented to person, place, and time. He appears well-developed and well-nourished.  HENT:  Mouth/Throat: No oropharyngeal exudate.  Eyes: No scleral icterus.  Cardiovascular: Normal rate, regular rhythm, normal heart sounds and intact distal pulses.   Pulmonary/Chest: Effort normal and breath sounds normal.  Abdominal: Soft. Bowel sounds are normal.  Musculoskeletal: Normal range of motion. He exhibits no edema.  Lymphadenopathy:    He has no cervical adenopathy.  Neurological: He is alert and oriented to person, place, and time.  Skin: Skin is warm and dry.  Psychiatric: He has a normal mood and affect.          Assessment & Plan:  Patient here today to screen for the Reprieve Study. He was referred by Dr. Daiva Eves last week. We reviewed the study consent and answered all his questions about it. He understands it is completely voluntary and if he enrolls on the study he is expected to keep appointments and take the study drug as instructed. His liver enzymes were up on the last blood draw on 09/26/13 so we repeated a CMET and also got lipids since he was not fasting for the last ones. He said he had started taking flexeril shortly before that blood draw for neck strain, but is not taking them now and denies any other new substance he could have been exposed to. He denies any problems currently and his exam is WNL. We will schedule a entry visit once his eligibility is verified.

## 2013-10-14 ENCOUNTER — Telehealth: Payer: Self-pay | Admitting: *Deleted

## 2013-10-14 NOTE — Telephone Encounter (Signed)
Called [name] [name] to let him know he is ineligible for the Reprieve Study. His repeat CMET showed elevated lever enzymes of AST 124 and ALT of 337. His ALT has to be under 2.5 times ULN for him to be eligible. I will have him follow up with Dr. Daiva Eves about his labs.

## 2013-10-14 NOTE — Telephone Encounter (Signed)
Patient called back and he is concerned that the Flexeril he's been taking and the Aleve prior to that may have caused the abnormal liver results. He is going to stop these medications for the next two weeks so that his labs can be repeated.

## 2013-10-14 NOTE — Telephone Encounter (Signed)
That is fine but I doubt that would explain his LFT elevation. Is he drinking etoh?

## 2013-10-17 ENCOUNTER — Telehealth: Payer: Self-pay | Admitting: *Deleted

## 2013-10-17 DIAGNOSIS — Z21 Asymptomatic human immunodeficiency virus [HIV] infection status: Secondary | ICD-10-CM

## 2013-10-17 NOTE — Telephone Encounter (Signed)
Talked with Dr. Daiva Eves about [name] [name]'s elevated liver enzymes. We will wait a week and then have him come in and recheck the enzymes, also draw an acute hepatitis panel that day. I talked with Romeo Apple and he is scheduled to come in June 10th for labs. He says he only had a few beers before the last lab draw.

## 2013-10-26 ENCOUNTER — Other Ambulatory Visit: Payer: BC Managed Care – PPO

## 2014-02-07 ENCOUNTER — Other Ambulatory Visit: Payer: BC Managed Care – PPO

## 2014-02-07 DIAGNOSIS — B20 Human immunodeficiency virus [HIV] disease: Secondary | ICD-10-CM

## 2014-02-07 LAB — COMPLETE METABOLIC PANEL WITH GFR
ALBUMIN: 4.4 g/dL (ref 3.5–5.2)
ALK PHOS: 129 U/L — AB (ref 39–117)
ALT: 266 U/L — ABNORMAL HIGH (ref 0–53)
AST: 120 U/L — ABNORMAL HIGH (ref 0–37)
BUN: 16 mg/dL (ref 6–23)
CALCIUM: 9.6 mg/dL (ref 8.4–10.5)
CO2: 26 meq/L (ref 19–32)
Chloride: 103 mEq/L (ref 96–112)
Creat: 1.19 mg/dL (ref 0.50–1.35)
GFR, EST AFRICAN AMERICAN: 85 mL/min
GFR, EST NON AFRICAN AMERICAN: 74 mL/min
GLUCOSE: 107 mg/dL — AB (ref 70–99)
POTASSIUM: 4.4 meq/L (ref 3.5–5.3)
Sodium: 136 mEq/L (ref 135–145)
Total Bilirubin: 0.7 mg/dL (ref 0.2–1.2)
Total Protein: 7.5 g/dL (ref 6.0–8.3)

## 2014-02-07 LAB — CBC WITH DIFFERENTIAL/PLATELET
BASOS PCT: 0 % (ref 0–1)
Basophils Absolute: 0 10*3/uL (ref 0.0–0.1)
EOS ABS: 0.1 10*3/uL (ref 0.0–0.7)
Eosinophils Relative: 2 % (ref 0–5)
HEMATOCRIT: 38.5 % — AB (ref 39.0–52.0)
HEMOGLOBIN: 12.6 g/dL — AB (ref 13.0–17.0)
Lymphocytes Relative: 36 % (ref 12–46)
Lymphs Abs: 2.3 10*3/uL (ref 0.7–4.0)
MCH: 22.5 pg — AB (ref 26.0–34.0)
MCHC: 32.7 g/dL (ref 30.0–36.0)
MCV: 68.8 fL — ABNORMAL LOW (ref 78.0–100.0)
MONO ABS: 0.4 10*3/uL (ref 0.1–1.0)
MONOS PCT: 7 % (ref 3–12)
NEUTROS ABS: 3.5 10*3/uL (ref 1.7–7.7)
Neutrophils Relative %: 55 % (ref 43–77)
Platelets: 193 10*3/uL (ref 150–400)
RBC: 5.6 MIL/uL (ref 4.22–5.81)
RDW: 16 % — ABNORMAL HIGH (ref 11.5–15.5)
WBC: 6.3 10*3/uL (ref 4.0–10.5)

## 2014-02-07 LAB — HEPATITIS C ANTIBODY: HCV AB: NEGATIVE

## 2014-02-08 LAB — HIV-1 RNA QUANT-NO REFLEX-BLD
HIV 1 RNA Quant: <20 copies/mL (ref <20)
HIV-1 RNA Quant, Log: <1.3 {Log} (ref <1.30)

## 2014-02-08 LAB — T-HELPER CELL (CD4) - (RCID CLINIC ONLY)
CD4 % Helper T Cell: 45 % (ref 33–55)
CD4 T Cell Abs: 1000 /uL (ref 400–2700)

## 2014-02-20 ENCOUNTER — Other Ambulatory Visit: Payer: Self-pay | Admitting: *Deleted

## 2014-02-20 DIAGNOSIS — A6002 Herpesviral infection of other male genital organs: Secondary | ICD-10-CM

## 2014-02-20 MED ORDER — VALACYCLOVIR HCL 1 G PO TABS: 500.0000 mg | ORAL_TABLET | Freq: Every day | ORAL | Status: DC

## 2014-03-01 ENCOUNTER — Ambulatory Visit (INDEPENDENT_AMBULATORY_CARE_PROVIDER_SITE_OTHER): Payer: BC Managed Care – PPO | Admitting: Infectious Disease

## 2014-03-01 ENCOUNTER — Telehealth: Payer: Self-pay | Admitting: Licensed Clinical Social Worker

## 2014-03-01 ENCOUNTER — Encounter: Payer: Self-pay | Admitting: Infectious Disease

## 2014-03-01 ENCOUNTER — Other Ambulatory Visit: Payer: Self-pay | Admitting: Infectious Disease

## 2014-03-01 VITALS — BP 149/95 | HR 62 | Temp 98.0°F | Wt 186.1 lb

## 2014-03-01 DIAGNOSIS — Z23 Encounter for immunization: Secondary | ICD-10-CM | POA: Diagnosis not present

## 2014-03-01 DIAGNOSIS — R7401 Elevation of levels of liver transaminase levels: Secondary | ICD-10-CM

## 2014-03-01 DIAGNOSIS — B20 Human immunodeficiency virus [HIV] disease: Secondary | ICD-10-CM | POA: Diagnosis not present

## 2014-03-01 DIAGNOSIS — R740 Nonspecific elevation of levels of transaminase and lactic acid dehydrogenase [LDH]: Secondary | ICD-10-CM

## 2014-03-01 DIAGNOSIS — N528 Other male erectile dysfunction: Secondary | ICD-10-CM | POA: Diagnosis not present

## 2014-03-01 LAB — COMPLETE METABOLIC PANEL WITH GFR
ALK PHOS: 122 U/L — AB (ref 39–117)
ALT: 150 U/L — ABNORMAL HIGH (ref 0–53)
AST: 73 U/L — AB (ref 0–37)
Albumin: 4.2 g/dL (ref 3.5–5.2)
BUN: 11 mg/dL (ref 6–23)
CO2: 26 mEq/L (ref 19–32)
Calcium: 9.5 mg/dL (ref 8.4–10.5)
Chloride: 101 mEq/L (ref 96–112)
Creat: 1.19 mg/dL (ref 0.50–1.35)
GFR, Est African American: 85 mL/min
GFR, Est Non African American: 74 mL/min
Glucose, Bld: 77 mg/dL (ref 70–99)
POTASSIUM: 4.7 meq/L (ref 3.5–5.3)
SODIUM: 135 meq/L (ref 135–145)
Total Bilirubin: 0.7 mg/dL (ref 0.2–1.2)
Total Protein: 7.4 g/dL (ref 6.0–8.3)

## 2014-03-01 LAB — FERRITIN: FERRITIN: 263 ng/mL (ref 22–322)

## 2014-03-01 NOTE — Addendum Note (Signed)
Addended by: Starleen ArmsYARBOROUGH, TAMIKA D on: 03/01/2014 03:28 PM   Modules accepted: Orders

## 2014-03-01 NOTE — Telephone Encounter (Signed)
Informed patient of his appointment with [facility] on 03/09/14 at 2pm

## 2014-03-01 NOTE — Progress Notes (Signed)
Subjective:    Patient ID: [name] [name], male    DOB: 10/23/1970, 43 y.o.   MRN: [id number]  HPI   [age].o. male who had been doing superbly well on his antiviral regimen TRIUMEQ  With undetectable VL and very healthy CD4 count  Lab Results  Component Value Date   HIV1RNAQUANT <20 02/07/2014   Lab Results  Component Value Date   CD4TABS 1000 02/07/2014   CD4TABS 1060 09/26/2013   CD4TABS 1380 05/24/2013     BP is worse today after he started nonsteroidal anti-inflammatory drug.  We reviewed all his labs and noticed that his liver function tests had shown a spike in his transaminases in May but has persisted through to the fall. His alkaline phosphatase is also audible his bilirubin has not.  He seems to have had something similar happen in 2010 in September. He is not known to have viral hepatitis B or C. bladder was checked with his recent labs and negative.  He denies drinking alcohol frequently he is not known to have gallstones. Prior ultrasound of his liver when he had previous elevations in his liver function test was normal.  At that time Dr. Philipp DeputyVollmer thought he had spike in his LFTS due to EFV and this was stopped. He saw Dr. Ewing SchleinMagod but Dr. Ewing SchleinMagod appeared to agree that the pts LFTs were high due to a medicine but it seems he did not have much in the way of database on the note that was uploaded to our electronic medical record.      Review of Systems  Constitutional: Negative for fever, chills, diaphoresis, activity change, appetite change, fatigue and unexpected weight change.  HENT: Negative for congestion, rhinorrhea, sinus pressure, sneezing, sore throat and trouble swallowing.   Eyes: Negative for photophobia and visual disturbance.  Respiratory: Negative for cough, chest tightness, shortness of breath, wheezing and stridor.   Cardiovascular: Negative for chest pain, palpitations and leg swelling.  Gastrointestinal: Negative for nausea, vomiting, abdominal pain,  diarrhea, constipation, blood in stool, abdominal distention and anal bleeding.  Genitourinary: Negative for dysuria, hematuria, flank pain and difficulty urinating.  Musculoskeletal: Negative for arthralgias, back pain, gait problem, joint swelling and myalgias.  Skin: Negative for color change, pallor, rash and wound.  Neurological: Negative for dizziness, tremors, weakness and light-headedness.  Hematological: Negative for adenopathy. Does not bruise/bleed easily.  Psychiatric/Behavioral: Negative for behavioral problems, confusion, sleep disturbance, dysphoric mood, decreased concentration and agitation.       Objective:   Physical Exam  Constitutional: He is oriented to person, place, and time. He appears well-developed and well-nourished. No distress.  HENT:  Head: Normocephalic and atraumatic.  Mouth/Throat: Oropharynx is clear and moist. No oropharyngeal exudate.  Eyes: Conjunctivae and EOM are normal. Pupils are equal, round, and reactive to light. No scleral icterus.  Neck: Normal range of motion. Neck supple. No JVD present.  Cardiovascular: Normal rate, regular rhythm and normal heart sounds.  Exam reveals no gallop and no friction rub.   No murmur heard. Pulmonary/Chest: Effort normal and breath sounds normal. No respiratory distress. He has no wheezes. He has no rales. He exhibits no tenderness.  Abdominal: He exhibits no distension and no mass. There is no tenderness. There is no rebound and no guarding.  Musculoskeletal: He exhibits no edema and no tenderness.  Lymphadenopathy:    He has no cervical adenopathy.  Neurological: He is alert and oriented to person, place, and time. He has normal reflexes. He exhibits normal muscle tone. Coordination normal.  Skin: Skin is warm and dry. He is not diaphoretic. No erythema. No pallor.  Psychiatric: He has a normal mood and affect. His behavior is normal. Judgment and thought content normal.          Assessment & Plan:    Elevated liver function tests transaminases are up in particular his ALT and also AST and alkaline phosphatase.: This at happen before 2010 and was blamed on Sustiva.  Check a viral hepatitis panel today, we will check repeat liver function tests today we will check anti-liver kidney abs, anti smooth muscle abs, and order RUQ US. We'll refer him back to gastroenterology. I spent greater than 25 minutes with the patient including greater than 50% of time in face to face counsel of the patient and in coordination of their care.    HIV: continue Triumeq  I am skeptical that [facility] is playing any role in LFT being up as they were already up when [facility]. Certainly him being [facility] and carotid TRIUMEQ could him last a hepatitis B infection so that is worth exploring as below I sincerely doubt that he has a drug-induced hepatitis from Epzicom or any visual or anti-retro-virals   erectile dysfunction check testosterone  .    HTN: continue ACEI but combine with HCTZ Single tablet being followed by Dr Nehemiah SettlePolite  Prevention: FRS is 1% great candidate for REPREIVE study and introduced to [employer] [employer]

## 2014-03-02 LAB — TESTOSTERONE: TESTOSTERONE: 399 ng/dL (ref 300–890)

## 2014-03-02 LAB — HEPATITIS PANEL, ACUTE
HCV Ab: NEGATIVE
HEP B C IGM: NONREACTIVE
Hep A IgM: NONREACTIVE
Hepatitis B Surface Ag: NEGATIVE

## 2014-03-03 LAB — ANTI-MICROSOMAL ANTIBODY LIVER / KIDNEY: LKM1 Ab: <=20 U (ref <=20.0)

## 2014-03-06 LAB — ANTI-SMOOTH MUSCLE ANTIBODY, IGG: SMOOTH MUSCLE AB: 74 U — AB (ref <20)

## 2014-03-09 ENCOUNTER — Other Ambulatory Visit: Payer: Self-pay | Admitting: Gastroenterology

## 2014-03-09 DIAGNOSIS — R945 Abnormal results of liver function studies: Principal | ICD-10-CM

## 2014-03-09 DIAGNOSIS — R7989 Other specified abnormal findings of blood chemistry: Secondary | ICD-10-CM

## 2014-03-13 ENCOUNTER — Ambulatory Visit
Admission: RE | Admit: 2014-03-13 | Discharge: 2014-03-13 | Disposition: A | Discharge status: Home / Self Care | Payer: BC Managed Care – PPO | Source: Ambulatory Visit | Attending: Gastroenterology | Admitting: Gastroenterology

## 2014-03-13 DIAGNOSIS — R945 Abnormal results of liver function studies: Principal | ICD-10-CM

## 2014-03-13 DIAGNOSIS — R7989 Other specified abnormal findings of blood chemistry: Secondary | ICD-10-CM

## 2014-05-14 ENCOUNTER — Other Ambulatory Visit: Payer: Self-pay | Admitting: Infectious Disease

## 2014-05-14 DIAGNOSIS — B20 Human immunodeficiency virus [HIV] disease: Secondary | ICD-10-CM

## 2014-05-24 ENCOUNTER — Encounter: Payer: Self-pay | Admitting: Infectious Disease

## 2014-05-24 ENCOUNTER — Ambulatory Visit (INDEPENDENT_AMBULATORY_CARE_PROVIDER_SITE_OTHER): Payer: BLUE CROSS/BLUE SHIELD | Admitting: Infectious Disease

## 2014-05-24 VITALS — BP 159/100 | HR 60 | Temp 97.2°F | Wt 187.0 lb

## 2014-05-24 DIAGNOSIS — Z79899 Other long term (current) drug therapy: Secondary | ICD-10-CM

## 2014-05-24 DIAGNOSIS — K759 Inflammatory liver disease, unspecified: Secondary | ICD-10-CM | POA: Diagnosis not present

## 2014-05-24 DIAGNOSIS — K754 Autoimmune hepatitis: Secondary | ICD-10-CM | POA: Diagnosis not present

## 2014-05-24 DIAGNOSIS — I10 Essential (primary) hypertension: Secondary | ICD-10-CM | POA: Diagnosis not present

## 2014-05-24 DIAGNOSIS — R740 Nonspecific elevation of levels of transaminase and lactic acid dehydrogenase [LDH]: Secondary | ICD-10-CM

## 2014-05-24 DIAGNOSIS — Z113 Encounter for screening for infections with a predominantly sexual mode of transmission: Secondary | ICD-10-CM

## 2014-05-24 DIAGNOSIS — R7401 Elevation of levels of liver transaminase levels: Secondary | ICD-10-CM

## 2014-05-24 DIAGNOSIS — B20 Human immunodeficiency virus [HIV] disease: Secondary | ICD-10-CM | POA: Diagnosis not present

## 2014-05-24 LAB — LIPID PANEL
CHOLESTEROL: 154 mg/dL (ref 0–200)
HDL: 53 mg/dL (ref >39)
LDL Cholesterol: 88 mg/dL (ref 0–99)
TRIGLYCERIDES: 66 mg/dL (ref <150)
Total CHOL/HDL Ratio: 2.9 Ratio
VLDL: 13 mg/dL (ref 0–40)

## 2014-05-24 LAB — CBC WITH DIFFERENTIAL/PLATELET
BASOS ABS: 0 10*3/uL (ref 0.0–0.1)
Basophils Relative: 0 % (ref 0–1)
Eosinophils Absolute: 0.2 10*3/uL (ref 0.0–0.7)
Eosinophils Relative: 2 % (ref 0–5)
HCT: 39.1 % (ref 39.0–52.0)
Hemoglobin: 12.9 g/dL — ABNORMAL LOW (ref 13.0–17.0)
LYMPHS ABS: 3 10*3/uL (ref 0.7–4.0)
LYMPHS PCT: 39 % (ref 12–46)
MCH: 23.2 pg — AB (ref 26.0–34.0)
MCHC: 33 g/dL (ref 30.0–36.0)
MCV: 70.2 fL — AB (ref 78.0–100.0)
MPV: 9.7 fL (ref 8.6–12.4)
Monocytes Absolute: 0.5 10*3/uL (ref 0.1–1.0)
Monocytes Relative: 6 % (ref 3–12)
Neutro Abs: 4.1 10*3/uL (ref 1.7–7.7)
Neutrophils Relative %: 53 % (ref 43–77)
Platelets: 238 10*3/uL (ref 150–400)
RBC: 5.57 MIL/uL (ref 4.22–5.81)
RDW: 16.3 % — AB (ref 11.5–15.5)
WBC: 7.7 10*3/uL (ref 4.0–10.5)

## 2014-05-24 LAB — HEPATITIS PANEL, ACUTE
HCV AB: NEGATIVE
HEP B S AG: NEGATIVE
Hep A IgM: NONREACTIVE
Hep B C IgM: NONREACTIVE

## 2014-05-24 LAB — COMPLETE METABOLIC PANEL WITH GFR
ALBUMIN: 4.5 g/dL (ref 3.5–5.2)
ALK PHOS: 155 U/L — AB (ref 39–117)
ALT: 62 U/L — AB (ref 0–53)
AST: 34 U/L (ref 0–37)
BUN: 12 mg/dL (ref 6–23)
CHLORIDE: 101 meq/L (ref 96–112)
CO2: 25 mEq/L (ref 19–32)
Calcium: 10.1 mg/dL (ref 8.4–10.5)
Creat: 1.04 mg/dL (ref 0.50–1.35)
GFR, Est Non African American: 87 mL/min
Glucose, Bld: 93 mg/dL (ref 70–99)
Potassium: 4.3 mEq/L (ref 3.5–5.3)
Sodium: 138 mEq/L (ref 135–145)
TOTAL PROTEIN: 7.5 g/dL (ref 6.0–8.3)
Total Bilirubin: 0.6 mg/dL (ref 0.2–1.2)

## 2014-05-24 LAB — RPR

## 2014-05-24 LAB — PHOSPHORUS: Phosphorus: 2.6 mg/dL (ref 2.3–4.6)

## 2014-05-24 NOTE — Progress Notes (Signed)
Subjective:    Patient ID: [name] [name], male    DOB: June 28, 1970, 44 y.o.   MRN: [id number]  HPI   Subjective:    Patient ID: [name] [name], male    DOB: 1971/04/14, 44 y.o.   MRN: [id number]  HPI   5.o. male who had been doing superbly well on his antiviral regimen TRIUMEQ  With undetectable VL and very healthy CD4 count  Lab Results  Component Value Date   HIV1RNAQUANT <20 02/07/2014   Lab Results  Component Value Date   CD4TABS 1000 02/07/2014   CD4TABS 1060 09/26/2013   CD4TABS 1380 05/24/2013      We reviewed all his labs at last visit noticed that his liver function tests had shown a spike in his transaminases in May 2015 but has persisted through to the fall. His alkaline phosphatase is also up but  his bilirubin has not.  He seems to have had something similar happen in 2010 in September. He is not known to have viral hepatitis B or C. bladder was checked with his recent labs and negative.  He denies drinking alcohol frequently he is not known to have gallstones. Prior ultrasound of his liver when he had previous elevations in his liver function test was normal.  At that time Dr. Philipp Deputy thought he had spike in his LFTS due to EFV and this was stopped. He saw Dr. Ewing Schlein but Dr. Ewing Schlein appeared to agree that the pts LFTs were high due to a medicine  Possibly.  I traced use of his ARVS and can show that he was changed from TIvicay (dolutegravir)  and Truvada (EMtricitabine/Tenofovir) to TRIUMEQ (dolutegravir/abacacavir/lamivudine) in November of 2014 and by January of 2015 still had normal LFTs. By May of 2015 the LFTs were up and have been ever since. One problem with the change I made would have been that if he had underlying hep B that could have declared itself but that is not the case. 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 He has not been back to see Dr. Ewing Schlein yet. He has  also not had repeat bloodwork here. He is still on TRIUMEQ and highly compliant. He has had no etoh since New Years Eve.           Assessment & Plan:      Review of Systems  Constitutional: Negative for fever, chills, diaphoresis, activity change, appetite change, fatigue and unexpected weight change.  HENT: Negative for congestion, rhinorrhea, sinus pressure, sneezing, sore throat and trouble swallowing.   Eyes: Negative for photophobia and visual disturbance.  Respiratory: Negative for cough, chest tightness, shortness of breath, wheezing and stridor.   Cardiovascular: Negative for chest pain, palpitations and leg swelling.  Gastrointestinal: Negative for nausea, vomiting, abdominal pain, diarrhea, constipation, blood in stool, abdominal distention and anal bleeding.  Genitourinary: Negative for dysuria, hematuria, flank pain and difficulty urinating.  Musculoskeletal: Negative for myalgias, back pain, joint swelling, arthralgias and gait problem.  Skin: Negative for color change, pallor, rash and wound.  Neurological: Negative for dizziness, tremors, weakness and light-headedness.  Hematological: Negative for adenopathy. Does not bruise/bleed easily.  Psychiatric/Behavioral: Negative for behavioral problems, confusion, sleep disturbance, dysphoric mood, decreased concentration and agitation.       Objective:   Physical Exam  Constitutional: He is oriented to person, place, and time. He appears well-developed and well-nourished.  HENT:  Head: Normocephalic and atraumatic.  Eyes: Conjunctivae  and EOM are normal.  Neck: Normal range of motion. Neck supple.  Cardiovascular: Normal rate and regular rhythm.   Pulmonary/Chest: Effort normal. No respiratory distress. He has no wheezes.  Abdominal: Soft. He exhibits no distension. There is no tenderness. There is no rebound.  Musculoskeletal: Normal range of motion. He exhibits no edema or tenderness.  Neurological: He is alert and  oriented to person, place, and time.  Skin: Skin is warm and dry. No rash noted. No erythema. No pallor.          Assessment & Plan:  Elevated liver function tests transaminases are up in particular his ALT and also AST and alkaline phosphatase.: This at happen before 2010 and was blamed on Sustiva. LABS DONE IN October SHOWED HIS ANTI F ACTIN ANTIBODY TO BE ELEVATED AT 70 which could be consistent with an autoimmune hepatitis.  I would like him to check in with Dr. Ewing SchleinMagod to see what he thinks about this and ? Other further workup is necessary. I am also happy to change him back to Tivicay and Truvada from his TRIUMEQ though as I mentioned we did not see change in his LFTS until he had been changed from prior regimen to new one for more than 6 months and he had prior history of much more markedly elevated LFTs on a different HIV regimen involving sustiva which makes me question the idea of drug induced hepatitis.  I spent greater than 25 minutes with the patient including greater than 50% of time in face to face counsel of the patient and in coordination of their care.     HIV: see above and will check labs today. Can consider changing back to Tivicay and Truvada if Dr. Ewing SchleinMagod believes the LFT changes are genuinely due to an autoimmune hepatitis.      HTN: continue ACEI but combine with HCTZ Single tablet being followed by Dr Nehemiah SettlePolite

## 2014-05-24 NOTE — Addendum Note (Signed)
Addended by: Starleen ArmsYARBOROUGH, Divon Krabill D on: 05/24/2014 11:12 AM   Modules accepted: Orders

## 2014-05-25 LAB — URINE CYTOLOGY ANCILLARY ONLY
CHLAMYDIA, DNA PROBE: NEGATIVE
Neisseria Gonorrhea: NEGATIVE

## 2014-05-25 LAB — HIV-1 RNA QUANT-NO REFLEX-BLD: HIV-1 RNA Quant, Log: <1.3 {Log} (ref <1.30)

## 2014-05-25 LAB — ANTI-SMOOTH MUSCLE ANTIBODY, IGG: SMOOTH MUSCLE AB: 124 U — AB (ref <20)

## 2014-05-25 LAB — T-HELPER CELL (CD4) - (RCID CLINIC ONLY)
CD4 T CELL HELPER: 43 % (ref 33–55)
CD4 T Cell Abs: 1260 /uL (ref 400–2700)

## 2014-06-12 ENCOUNTER — Other Ambulatory Visit: Payer: Self-pay | Admitting: Infectious Disease

## 2014-06-14 ENCOUNTER — Ambulatory Visit: Payer: BC Managed Care – PPO | Admitting: Infectious Disease

## 2014-06-28 ENCOUNTER — Telehealth: Payer: Self-pay | Admitting: Infectious Disease

## 2014-06-28 NOTE — Telephone Encounter (Signed)
Call Dr. Ewing SchleinMagod re possible AI hepatitis treatment

## 2014-08-03 ENCOUNTER — Ambulatory Visit: Payer: BLUE CROSS/BLUE SHIELD | Admitting: Infectious Disease

## 2014-08-07 ENCOUNTER — Ambulatory Visit: Payer: BLUE CROSS/BLUE SHIELD | Admitting: Infectious Disease

## 2014-08-15 ENCOUNTER — Encounter: Payer: Self-pay | Admitting: Infectious Disease

## 2014-08-15 ENCOUNTER — Ambulatory Visit (INDEPENDENT_AMBULATORY_CARE_PROVIDER_SITE_OTHER): Payer: BLUE CROSS/BLUE SHIELD | Admitting: Infectious Disease

## 2014-08-15 VITALS — BP 145/82 | HR 66 | Wt 188.0 lb

## 2014-08-15 DIAGNOSIS — I10 Essential (primary) hypertension: Secondary | ICD-10-CM

## 2014-08-15 DIAGNOSIS — Z113 Encounter for screening for infections with a predominantly sexual mode of transmission: Secondary | ICD-10-CM

## 2014-08-15 DIAGNOSIS — K716 Toxic liver disease with hepatitis, not elsewhere classified: Secondary | ICD-10-CM

## 2014-08-15 DIAGNOSIS — R740 Nonspecific elevation of levels of transaminase and lactic acid dehydrogenase [LDH]: Secondary | ICD-10-CM

## 2014-08-15 DIAGNOSIS — K759 Inflammatory liver disease, unspecified: Secondary | ICD-10-CM | POA: Diagnosis not present

## 2014-08-15 DIAGNOSIS — Z79899 Other long term (current) drug therapy: Secondary | ICD-10-CM

## 2014-08-15 DIAGNOSIS — K754 Autoimmune hepatitis: Secondary | ICD-10-CM

## 2014-08-15 DIAGNOSIS — B20 Human immunodeficiency virus [HIV] disease: Secondary | ICD-10-CM | POA: Diagnosis not present

## 2014-08-15 DIAGNOSIS — R7401 Elevation of levels of liver transaminase levels: Secondary | ICD-10-CM

## 2014-08-15 DIAGNOSIS — T50905A Adverse effect of unspecified drugs, medicaments and biological substances, initial encounter: Secondary | ICD-10-CM | POA: Insufficient documentation

## 2014-08-15 HISTORY — DX: Toxic liver disease with hepatitis, not elsewhere classified: K71.6

## 2014-08-15 HISTORY — DX: Adverse effect of unspecified drugs, medicaments and biological substances, initial encounter: T50.905A

## 2014-08-15 LAB — COMPLETE METABOLIC PANEL WITH GFR
ALT: 76 U/L — AB (ref 0–53)
AST: 37 U/L (ref 0–37)
Albumin: 4.2 g/dL (ref 3.5–5.2)
Alkaline Phosphatase: 113 U/L (ref 39–117)
BUN: 16 mg/dL (ref 6–23)
CHLORIDE: 102 meq/L (ref 96–112)
CO2: 25 meq/L (ref 19–32)
CREATININE: 1.18 mg/dL (ref 0.50–1.35)
Calcium: 9.8 mg/dL (ref 8.4–10.5)
GFR, EST AFRICAN AMERICAN: 86 mL/min
GFR, Est Non African American: 75 mL/min
GLUCOSE: 80 mg/dL (ref 70–99)
Potassium: 4.4 mEq/L (ref 3.5–5.3)
SODIUM: 136 meq/L (ref 135–145)
Total Bilirubin: 0.4 mg/dL (ref 0.2–1.2)
Total Protein: 7.1 g/dL (ref 6.0–8.3)

## 2014-08-15 LAB — LIPID PANEL
Cholesterol: 147 mg/dL (ref 0–200)
HDL: 45 mg/dL (ref >=40)
LDL CALC: 73 mg/dL (ref 0–99)
Total CHOL/HDL Ratio: 3.3 Ratio
Triglycerides: 143 mg/dL (ref <150)
VLDL: 29 mg/dL (ref 0–40)

## 2014-08-15 NOTE — Progress Notes (Signed)
Subjective:    Patient ID: [name] [name], male    DOB: 03-22-1971, 44 y.o.   MRN: [id number]  HPI    Subjective:    Patient ID: [name] [name], male    DOB: Jun 03, 1970, 44 y.o.   MRN: [id number]  HPI   44.o. male who had been doing superbly well on his antiviral regimen TRIUMEQ  With undetectable VL and very healthy CD4 count  Lab Results  Component Value Date   HIV1RNAQUANT <20 05/24/2014   Lab Results  Component Value Date   CD4TABS 1260 05/24/2014   CD4TABS 1000 02/07/2014   CD4TABS 1060 09/26/2013   He HAS had problems with elevated transaminases that while improved have not normalized.   When I read back through the chart I can see that he was initially started on Atripla in 2008 to which he had a rash. He then was able to tolerate this again.  In September of 2009 he called in and had noticed icterus after he started takign a "multiviamin."  He came in for stat LFTS and this showed:  Alkaline Phosphatase of 1642, Bilirubin of 8.1 DB of 5.2 ID of 2.9. Transaminases were at  Of note his NWGN562/130 had already trended up in July with Alk phosph at 1185 and bili of 2.5 ast/alt of  264/426. GI (Dr. 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   Atripla had been held in interim since September 10th, 2010.  Patient was switched to Isentress and Truvada by Dr. Tomma Lightning on 02/23/2009. Alk phosp had come down already to 438, ast/alt at 23/37 and bilirubin at 1.4  Transaminases were normalized since then alk phosph barely above normal for several visits.  We subsequently changed him to Tivicay and Truvada and LFTS still normal then to  [place] (dolutegravir/abacacavir/lamivudine) in November of 2014 and by January of 2015 still had normal LFTs. By May of 2015 the LFTs were up and had been ever since.    We did autoimmune and other hepatitis lab workup and his anti-smooth muscle antibody   (anti-F-actin) did come back positive at 70 and at 120  when rechecked.  He has seen Dr. Watt Climes who still believed Drug induced liver injury   I have discussed changing him back to Tivicay and Truvada in case there is in fact some issue with ABC causing his LFTs to be up. Also discussed going to NRTI free regimen such as Prezcobix and Tivicay. I do also wonder if he is taking herbal supplements that might be causing these issues. The episode in September when he was taking a "vitamin" is concerning.          Assessment & Plan:      Review of Systems  Constitutional: Negative for fever, chills, diaphoresis, activity change, appetite change, fatigue and unexpected weight change.  HENT: Negative for congestion, rhinorrhea, sinus pressure, sneezing, sore throat and trouble swallowing.   Eyes: Negative for photophobia and visual disturbance.  Respiratory: Negative for cough, chest tightness, shortness of breath, wheezing and stridor.   Cardiovascular: Negative for chest pain, palpitations and leg swelling.  Gastrointestinal: Negative for nausea, vomiting, abdominal pain, diarrhea, constipation, blood in stool, abdominal distention and anal bleeding.  Genitourinary: Negative for dysuria, hematuria, flank pain and difficulty urinating.  Musculoskeletal: Negative for myalgias, back pain, joint swelling, arthralgias and gait problem.  Skin: Negative for color change, pallor, rash and wound.  Neurological: Negative for dizziness, tremors, weakness and light-headedness.  Hematological: Negative for adenopathy.  Does not bruise/bleed easily.  Psychiatric/Behavioral: Negative for behavioral problems, confusion, sleep disturbance, dysphoric mood, decreased concentration and agitation.       Objective:   Physical Exam  Constitutional: He is oriented to person, place, and time. He appears well-developed and well-nourished.  HENT:  Head: Normocephalic and atraumatic.  Eyes: Conjunctivae and EOM are normal.  Neck: Normal range of motion. Neck supple.    Cardiovascular: Normal rate and regular rhythm.   Pulmonary/Chest: Effort normal. No respiratory distress. He has no wheezes.  Abdominal: Soft. He exhibits no distension. There is no tenderness. There is no rebound.  Musculoskeletal: Normal range of motion. He exhibits no edema or tenderness.  Neurological: He is alert and oriented to person, place, and time.  Skin: Skin is warm and dry. No rash noted. No erythema. No pallor.          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  We had extensive discussion re ARVs AI hepatitis    HIV: see above and will check labs today. Change back to Tivicay and Truvada if LFTS still up  HTN: continue ACEI with HCTZ Single tablet being followed by Dr Delfina Redwood

## 2014-08-16 LAB — CBC WITH DIFFERENTIAL/PLATELET
Basophils Absolute: 0.1 10*3/uL (ref 0.0–0.1)
Basophils Relative: 1 % (ref 0–1)
EOS PCT: 2 % (ref 0–5)
Eosinophils Absolute: 0.2 10*3/uL (ref 0.0–0.7)
HEMATOCRIT: 38.6 % — AB (ref 39.0–52.0)
Hemoglobin: 12.2 g/dL — ABNORMAL LOW (ref 13.0–17.0)
LYMPHS ABS: 2.7 10*3/uL (ref 0.7–4.0)
Lymphocytes Relative: 33 % (ref 12–46)
MCH: 22.5 pg — AB (ref 26.0–34.0)
MCHC: 31.6 g/dL (ref 30.0–36.0)
MCV: 71.2 fL — ABNORMAL LOW (ref 78.0–100.0)
MONOS PCT: 5 % (ref 3–12)
MPV: 10.4 fL (ref 8.6–12.4)
Monocytes Absolute: 0.4 10*3/uL (ref 0.1–1.0)
NEUTROS PCT: 59 % (ref 43–77)
Neutro Abs: 4.8 10*3/uL (ref 1.7–7.7)
Platelets: 253 10*3/uL (ref 150–400)
RBC: 5.42 MIL/uL (ref 4.22–5.81)
RDW: 16.6 % — ABNORMAL HIGH (ref 11.5–15.5)
WBC: 8.2 10*3/uL (ref 4.0–10.5)

## 2014-08-16 LAB — MICROALBUMIN / CREATININE URINE RATIO
Creatinine, Urine: 121.4 mg/dL
Microalb Creat Ratio: 1.6 mg/g (ref 0.0–30.0)
Microalb, Ur: 0.2 mg/dL (ref <2.0)

## 2014-08-16 LAB — T-HELPER CELL (CD4) - (RCID CLINIC ONLY)
CD4 % Helper T Cell: 46 % (ref 33–55)
CD4 T Cell Abs: 1280 /uL (ref 400–2700)

## 2014-08-16 LAB — URINE CYTOLOGY ANCILLARY ONLY
CHLAMYDIA, DNA PROBE: NEGATIVE
Neisseria Gonorrhea: NEGATIVE

## 2014-08-16 LAB — RPR

## 2014-08-17 ENCOUNTER — Telehealth: Payer: Self-pay | Admitting: *Deleted

## 2014-08-17 LAB — HIV-1 RNA QUANT-NO REFLEX-BLD
HIV 1 RNA Quant: <20 copies/mL (ref <20)
HIV-1 RNA Quant, Log: <1.3 {Log} (ref <1.30)

## 2014-08-17 NOTE — Telephone Encounter (Signed)
Patient came into clinic to pick up FMLA paperwork that was filled out by Dr. Daiva EvesVan Dam. Copy was made and placed in scan box. [name] [name]

## 2014-10-05 ENCOUNTER — Ambulatory Visit (INDEPENDENT_AMBULATORY_CARE_PROVIDER_SITE_OTHER): Payer: BLUE CROSS/BLUE SHIELD | Admitting: Infectious Disease

## 2014-10-05 ENCOUNTER — Encounter: Payer: Self-pay | Admitting: Infectious Disease

## 2014-10-05 VITALS — BP 131/82 | HR 58 | Temp 98.1°F | Ht 68.0 in | Wt 191.0 lb

## 2014-10-05 DIAGNOSIS — T50905A Adverse effect of unspecified drugs, medicaments and biological substances, initial encounter: Secondary | ICD-10-CM

## 2014-10-05 DIAGNOSIS — B20 Human immunodeficiency virus [HIV] disease: Secondary | ICD-10-CM

## 2014-10-05 DIAGNOSIS — I10 Essential (primary) hypertension: Secondary | ICD-10-CM

## 2014-10-05 DIAGNOSIS — R7401 Elevation of levels of liver transaminase levels: Secondary | ICD-10-CM

## 2014-10-05 DIAGNOSIS — K759 Inflammatory liver disease, unspecified: Secondary | ICD-10-CM

## 2014-10-05 DIAGNOSIS — R740 Nonspecific elevation of levels of transaminase and lactic acid dehydrogenase [LDH]: Secondary | ICD-10-CM | POA: Diagnosis not present

## 2014-10-05 DIAGNOSIS — K716 Toxic liver disease with hepatitis, not elsewhere classified: Secondary | ICD-10-CM

## 2014-10-05 DIAGNOSIS — K754 Autoimmune hepatitis: Secondary | ICD-10-CM

## 2014-10-05 MED ORDER — DOLUTEGRAVIR SODIUM 50 MG PO TABS: 50.0000 mg | ORAL_TABLET | Freq: Every day | ORAL | Status: DC

## 2014-10-05 MED ORDER — EMTRICITABINE-TENOFOVIR DF 200-300 MG PO TABS: 1.0000 | ORAL_TABLET | Freq: Every day | ORAL | Status: DC

## 2014-10-05 NOTE — Progress Notes (Signed)
 Subjective:    Patient ID: [name] [name], male    DOB: 10/30/1970, 44 y.o.   MRN: [id number]  HPI    Subjective:    Patient ID: [name] [name], male    DOB: 12/05/1970, 44 y.o.   MRN: [id number]  HPI   44.o. male who had been doing superbly well on his antiviral regimen TRIUMEQ  With undetectable VL and very healthy CD4 count  Lab Results  Component Value Date   HIV1RNAQUANT <20 08/15/2014   Lab Results  Component Value Date   CD4TABS 1280 08/15/2014   CD4TABS 1260 05/24/2014   CD4TABS 1000 02/07/2014   He HAS had problems with elevated transaminases that while improved have not normalized.   When I read back through the chart I can see that he was initially started on Atripla in 2008 to which he had a rash. He then was able to tolerate this again.  In September of 2009 he called in and had noticed icterus after he started takign a "multiviamin."  He came in for stat LFTS and this showed:  Alkaline Phosphatase of 1642, Bilirubin of 8.1 DB of 5.2 ID of 2.9. Transaminases were at  Of note his LFTS188/293 had already trended up in July with Alk phosph at 1185 and bili of 2.5 ast/alt of  264/426. GI (Dr. 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   Atripla had been held in interim since September 10th, 2010.  Patient was switched to Isentress and Truvada by Dr. Vollmer on 02/23/2009. Alk phosp had come down already to 438, ast/alt at 23/37 and bilirubin at 1.4  Transaminases were normalized since then alk phosph barely above normal for several visits.  We subsequently changed him to Tivicay and Truvada and LFTS still normal then to  TRIUMEQ (dolutegravir/abacacavir/lamivudine) in November of 2014 and by January of 2015 still had normal LFTs. By May of 2015 the LFTs were up and had been ever since.    We did autoimmune and other hepatitis lab workup and his anti-smooth muscle antibody   (anti-F-actin) did come back positive at 70 and at 120  when rechecked.  He has seen Dr. Magod who still believed Drug induced liver injury  He is back again today and without significant symptoms. He  does admit to drinking alcohol from time to time.  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          Assessment & Plan:      Review of Systems  Constitutional: Negative for fever, chills, diaphoresis, activity change, appetite change, fatigue and unexpected weight change.  HENT: Negative for congestion, rhinorrhea, sinus pressure, sneezing, sore throat and trouble swallowing.   Eyes: Negative for photophobia and visual disturbance.  Respiratory: Negative for cough, chest tightness, shortness of breath, wheezing and stridor.   Cardiovascular: Negative for chest pain, palpitations and leg swelling.  Gastrointestinal: Negative for nausea, vomiting, abdominal pain, diarrhea, constipation, blood in stool, abdominal distention and anal bleeding.  Genitourinary: Negative for dysuria, hematuria, flank pain and difficulty urinating.  Musculoskeletal: Negative for myalgias, back pain, joint swelling, arthralgias and gait problem.  Skin: Negative for color change, pallor, rash and wound.  Neurological: Negative for dizziness, tremors, weakness and light-headedness.  Hematological: Negative for adenopathy. Does not bruise/bleed easily.    Psychiatric/Behavioral: Negative for behavioral problems, confusion, sleep disturbance, dysphoric mood, decreased concentration and agitation.       Objective:   Physical Exam  Constitutional: He is oriented to person, place, and time. He appears well-developed and well-nourished.  HENT:  Head: Normocephalic and atraumatic.  Eyes: Conjunctivae and EOM are normal.  Neck: Normal range of motion. Neck supple.    Cardiovascular: Normal rate and regular rhythm.   Pulmonary/Chest: Effort normal. No respiratory distress. He has no wheezes.  Abdominal: Soft. He exhibits no distension. There is no tenderness. There is no rebound.  Musculoskeletal: Normal range of motion. He exhibits no edema or tenderness.  Neurological: He is alert and oriented to person, place, and time.  Skin: Skin is warm and dry. No rash noted. No erythema. No pallor.          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  I spent greater than 25 minutes with the patient including greater than 50% of time in face to face counsel of the patient regarding possible causes of his hepatitis in regards to various antiviral regimens and whether or not any of these regimens were responsible for any of his flares of elevated liver function tests or not. and in coordination of their care.  HIV: see above and change back to Tivicay and Truvada  We had extensive discussion re ARVs AI hepatitis     HTN: continue ACEI with HCTZ Single tablet being followed by Dr Delfina Redwood

## 2015-02-06 ENCOUNTER — Other Ambulatory Visit: Payer: Self-pay | Admitting: Infectious Disease

## 2015-02-19 ENCOUNTER — Ambulatory Visit (INDEPENDENT_AMBULATORY_CARE_PROVIDER_SITE_OTHER): Payer: BLUE CROSS/BLUE SHIELD

## 2015-02-19 DIAGNOSIS — Z23 Encounter for immunization: Secondary | ICD-10-CM

## 2015-03-05 ENCOUNTER — Other Ambulatory Visit: Payer: Self-pay | Admitting: *Deleted

## 2015-03-05 DIAGNOSIS — B20 Human immunodeficiency virus [HIV] disease: Secondary | ICD-10-CM

## 2015-03-05 MED ORDER — EMTRICITABINE-TENOFOVIR DF 200-300 MG PO TABS: 1.0000 | ORAL_TABLET | Freq: Every day | ORAL | Status: DC

## 2015-03-05 MED ORDER — DOLUTEGRAVIR SODIUM 50 MG PO TABS: 50.0000 mg | ORAL_TABLET | Freq: Every day | ORAL | Status: DC

## 2015-03-26 ENCOUNTER — Other Ambulatory Visit: Payer: Self-pay | Admitting: *Deleted

## 2015-03-26 DIAGNOSIS — A6002 Herpesviral infection of other male genital organs: Secondary | ICD-10-CM

## 2015-03-26 MED ORDER — VALACYCLOVIR HCL 1 G PO TABS: 500.0000 mg | ORAL_TABLET | Freq: Every day | ORAL | Status: DC

## 2015-03-26 MED ORDER — LISINOPRIL-HYDROCHLOROTHIAZIDE 20-12.5 MG PO TABS: 1.0000 | ORAL_TABLET | Freq: Every day | ORAL | Status: DC

## 2015-04-04 ENCOUNTER — Other Ambulatory Visit: Payer: Self-pay | Admitting: *Deleted

## 2015-04-04 DIAGNOSIS — B20 Human immunodeficiency virus [HIV] disease: Secondary | ICD-10-CM

## 2015-04-05 ENCOUNTER — Other Ambulatory Visit: Payer: BLUE CROSS/BLUE SHIELD

## 2015-04-09 ENCOUNTER — Other Ambulatory Visit: Payer: BLUE CROSS/BLUE SHIELD

## 2015-04-10 ENCOUNTER — Other Ambulatory Visit (INDEPENDENT_AMBULATORY_CARE_PROVIDER_SITE_OTHER): Payer: Self-pay

## 2015-04-10 DIAGNOSIS — B20 Human immunodeficiency virus [HIV] disease: Secondary | ICD-10-CM

## 2015-04-10 DIAGNOSIS — Z113 Encounter for screening for infections with a predominantly sexual mode of transmission: Secondary | ICD-10-CM

## 2015-04-10 DIAGNOSIS — Z79899 Other long term (current) drug therapy: Secondary | ICD-10-CM

## 2015-04-10 LAB — CBC WITH DIFFERENTIAL/PLATELET
Basophils Absolute: 0 10*3/uL (ref 0.0–0.1)
Basophils Relative: 0 % (ref 0–1)
EOS PCT: 2 % (ref 0–5)
Eosinophils Absolute: 0.2 10*3/uL (ref 0.0–0.7)
HEMATOCRIT: 36.3 % — AB (ref 39.0–52.0)
HEMOGLOBIN: 11.5 g/dL — AB (ref 13.0–17.0)
LYMPHS ABS: 2.9 10*3/uL (ref 0.7–4.0)
LYMPHS PCT: 38 % (ref 12–46)
MCH: 22.2 pg — ABNORMAL LOW (ref 26.0–34.0)
MCHC: 31.7 g/dL (ref 30.0–36.0)
MCV: 69.9 fL — AB (ref 78.0–100.0)
MONO ABS: 0.5 10*3/uL (ref 0.1–1.0)
MONOS PCT: 6 % (ref 3–12)
MPV: 10.4 fL (ref 8.6–12.4)
Neutro Abs: 4.2 10*3/uL (ref 1.7–7.7)
Neutrophils Relative %: 54 % (ref 43–77)
Platelets: 207 10*3/uL (ref 150–400)
RBC: 5.19 MIL/uL (ref 4.22–5.81)
RDW: 15.7 % — AB (ref 11.5–15.5)
WBC: 7.7 10*3/uL (ref 4.0–10.5)

## 2015-04-10 LAB — COMPLETE METABOLIC PANEL WITH GFR
ALBUMIN: 4 g/dL (ref 3.6–5.1)
ALK PHOS: 72 U/L (ref 40–115)
ALT: 12 U/L (ref 9–46)
AST: 18 U/L (ref 10–40)
BUN: 18 mg/dL (ref 7–25)
CALCIUM: 9 mg/dL (ref 8.6–10.3)
CO2: 26 mmol/L (ref 20–31)
CREATININE: 1.23 mg/dL (ref 0.60–1.35)
Chloride: 105 mmol/L (ref 98–110)
GFR, Est African American: 81 mL/min (ref >=60)
GFR, Est Non African American: 70 mL/min (ref >=60)
Glucose, Bld: 86 mg/dL (ref 65–99)
Potassium: 4 mmol/L (ref 3.5–5.3)
Sodium: 138 mmol/L (ref 135–146)
TOTAL PROTEIN: 6.5 g/dL (ref 6.1–8.1)
Total Bilirubin: 0.4 mg/dL (ref 0.2–1.2)

## 2015-04-10 LAB — LIPID PANEL
CHOL/HDL RATIO: 3.1 ratio (ref <=5.0)
Cholesterol: 143 mg/dL (ref 125–200)
HDL: 46 mg/dL (ref >=40)
LDL Cholesterol: 78 mg/dL (ref <130)
Triglycerides: 97 mg/dL (ref <150)
VLDL: 19 mg/dL (ref <30)

## 2015-04-11 LAB — RPR

## 2015-04-12 LAB — URINE CYTOLOGY ANCILLARY ONLY
Chlamydia: NEGATIVE
NEISSERIA GONORRHEA: NEGATIVE

## 2015-04-13 LAB — T-HELPER CELL (CD4) - (RCID CLINIC ONLY)
CD4 T CELL ABS: 1320 /uL (ref 400–2700)
CD4 T CELL HELPER: 45 % (ref 33–55)

## 2015-04-15 LAB — HIV-1 RNA QUANT-NO REFLEX-BLD
HIV 1 RNA Quant: <20 copies/mL (ref <20)
HIV-1 RNA Quant, Log: <1.3 Log copies/mL (ref <1.30)

## 2015-04-23 ENCOUNTER — Ambulatory Visit: Payer: BLUE CROSS/BLUE SHIELD | Admitting: Infectious Disease

## 2015-04-30 ENCOUNTER — Encounter: Payer: Self-pay | Admitting: *Deleted

## 2015-04-30 ENCOUNTER — Other Ambulatory Visit: Payer: Self-pay | Admitting: *Deleted

## 2015-04-30 DIAGNOSIS — Z006 Encounter for examination for normal comparison and control in clinical research program: Secondary | ICD-10-CM

## 2015-05-02 ENCOUNTER — Encounter: Payer: Self-pay | Admitting: Infectious Disease

## 2015-05-02 ENCOUNTER — Ambulatory Visit (INDEPENDENT_AMBULATORY_CARE_PROVIDER_SITE_OTHER): Payer: Self-pay | Admitting: Infectious Disease

## 2015-05-02 VITALS — BP 123/83 | HR 70 | Temp 98.0°F | Ht 68.0 in | Wt 189.5 lb

## 2015-05-02 DIAGNOSIS — Z23 Encounter for immunization: Secondary | ICD-10-CM

## 2015-05-02 DIAGNOSIS — A609 Anogenital herpesviral infection, unspecified: Secondary | ICD-10-CM

## 2015-05-02 DIAGNOSIS — A6002 Herpesviral infection of other male genital organs: Secondary | ICD-10-CM

## 2015-05-02 DIAGNOSIS — K759 Inflammatory liver disease, unspecified: Secondary | ICD-10-CM

## 2015-05-02 DIAGNOSIS — K716 Toxic liver disease with hepatitis, not elsewhere classified: Secondary | ICD-10-CM

## 2015-05-02 DIAGNOSIS — B20 Human immunodeficiency virus [HIV] disease: Secondary | ICD-10-CM

## 2015-05-02 DIAGNOSIS — I10 Essential (primary) hypertension: Secondary | ICD-10-CM

## 2015-05-02 DIAGNOSIS — K754 Autoimmune hepatitis: Secondary | ICD-10-CM

## 2015-05-02 DIAGNOSIS — T50905A Adverse effect of unspecified drugs, medicaments and biological substances, initial encounter: Secondary | ICD-10-CM

## 2015-05-02 MED ORDER — LISINOPRIL-HYDROCHLOROTHIAZIDE 20-12.5 MG PO TABS: 1.0000 | ORAL_TABLET | Freq: Every day | ORAL | Status: DC

## 2015-05-02 MED ORDER — EMTRICITABINE-TENOFOVIR AF 200-25 MG PO TABS: 1.0000 | ORAL_TABLET | Freq: Every day | ORAL | Status: DC

## 2015-05-02 MED ORDER — VALACYCLOVIR HCL 1 G PO TABS: 500.0000 mg | ORAL_TABLET | Freq: Every day | ORAL | Status: DC

## 2015-05-02 MED ORDER — DOLUTEGRAVIR SODIUM 50 MG PO TABS: 50.0000 mg | ORAL_TABLET | Freq: Every day | ORAL | Status: DC

## 2015-05-02 NOTE — Progress Notes (Signed)
Chief complaint: followup for HIV on medications  Subjective:    Patient ID: [name] [name], male    DOB: 1971/01/06, 44 y.o.   MRN: [id number]  HPI    Subjective:    Patient ID: [name] [name], male    DOB: November 19, 1970, 44 y.o.   MRN: [id number]  HPI   42 o. male who had been doing superbly well on his antiviral regimen TRIUMEQ  With undetectable VL and very healthy CD4 count  Lab Results  Component Value Date   HIV1RNAQUANT <20 04/10/2015   Lab Results  Component Value Date   CD4TABS 1320 04/10/2015   CD4TABS 1280 08/15/2014   CD4TABS 1260 05/24/2014   He HAS had problems with elevated transaminases that while improved have not normalized.   When I read back through the chart I can see that he was initially started on Atripla in 2008 to which he had a rash. He then was able to tolerate this again.  In September of 2009 he called in and had noticed icterus after he started takign a "multiviamin."  He came in for stat LFTS and this showed:  Alkaline Phosphatase of 1642, Bilirubin of 8.1 DB of 5.2 ID of 2.9. Transaminases were at  Of note his JOIN867/672 had already trended up in July with Alk phosph at 1185 and bili of 2.5 ast/alt of  264/426. GI (Dr. 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   Atripla had been held in interim since September 10th, 2010.  Patient was switched to Isentress and Truvada by Dr. Tomma Lightning on 02/23/2009. Alk phosp had come down already to 438, ast/alt at 23/37 and bilirubin at 1.4  Transaminases were normalized since then alk phosph barely above normal for several visits.  We subsequently changed him to Tivicay and Truvada and LFTS still normal then to  [place] (dolutegravir/abacacavir/lamivudine) in November of 2014 and by January of 2015 still had normal LFTs. By May of 2015 the LFTs were up and had been ever since.    We did autoimmune and other hepatitis lab workup and his anti-smooth muscle antibody    (anti-F-actin) did come back positive at 70 and at 120 when rechecked.  He has seen Dr. Watt Climes who still believed Drug induced liver injury  At last visit I changed him to [facility] and [facility] and he has had repeat labs with perfect virological suppression and healthy CD4 and normalization of LFTS.  Past Medical History  Diagnosis Date  . HIV infection ([place])   . Hypertension   . Transaminitis   . 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  No family history on file.    Social History   Social History  . Marital Status: Single    Spouse Name: N/A  . Number of Children: N/A  . Years of Education: N/A   Social History Main Topics  . Smoking status: Never Smoker   . Smokeless tobacco: Never Used  . Alcohol Use: 3.0 oz/week    5 Standard drinks or equivalent per week  . Drug Use: No  . Sexual Activity:    Partners: Male   Other Topics Concern  . None   Social History Narrative    Allergies  Allergen Reactions  . Sustiva [Efavirenz] Other (See Comments)    ? hepatitis     Current outpatient prescriptions:  .  dolutegravir (TIVICAY) 50 MG tablet, Take 1 tablet (50 mg total) by mouth daily., Disp: 30 tablet,  Rfl: 11 .  emtricitabine-tenofovir AF (DESCOVY) 200-25 MG tablet, Take 1 tablet by mouth daily., Disp: 30 tablet, Rfl: 11 .  lisinopril-hydrochlorothiazide (PRINZIDE,ZESTORETIC) 20-12.5 MG tablet, Take 1 tablet by mouth daily., Disp: 30 tablet, Rfl: 11 .  valACYclovir (VALTREX) 1000 MG tablet, Take 0.5 tablets (500 mg total) by mouth daily., Disp: 30 tablet, Rfl: 11         Assessment & Plan:      Review of Systems  Constitutional: Negative for fever, chills, diaphoresis, activity change, appetite change, fatigue and unexpected weight change.  HENT: Negative for congestion, rhinorrhea, sinus pressure, sneezing, sore throat and trouble swallowing.   Eyes: Negative for photophobia and visual disturbance.  Respiratory: Negative for  cough, chest tightness, shortness of breath, wheezing and stridor.   Cardiovascular: Negative for chest pain, palpitations and leg swelling.  Gastrointestinal: Negative for nausea, vomiting, abdominal pain, diarrhea, constipation, blood in stool, abdominal distention and anal bleeding.  Genitourinary: Negative for dysuria, hematuria, flank pain and difficulty urinating.  Musculoskeletal: Negative for myalgias, back pain, joint swelling, arthralgias and gait problem.  Skin: Negative for color change, pallor, rash and wound.  Neurological: Negative for dizziness, tremors, weakness and light-headedness.  Hematological: Negative for adenopathy. Does not bruise/bleed easily.  Psychiatric/Behavioral: Negative for behavioral problems, confusion, sleep disturbance, dysphoric mood, decreased concentration and agitation.       Objective:   Physical Exam  Constitutional: He is oriented to person, place, and time. He appears well-developed and well-nourished.  HENT:  Head: Normocephalic and atraumatic.  Eyes: Conjunctivae and EOM are normal.  Neck: Normal range of motion. Neck supple.  Cardiovascular: Normal rate and regular rhythm.   Pulmonary/Chest: Effort normal. No respiratory distress. He has no wheezes.  Abdominal: Soft. He exhibits no distension. There is no tenderness. There is no rebound.  Musculoskeletal: Normal range of motion. He exhibits no edema or tenderness.  Neurological: He is alert and oriented to person, place, and time.  Skin: Skin is warm and dry. No rash noted. No erythema. No pallor.  Psychiatric: He has a normal mood and affect. His behavior is normal. Judgment and thought content normal.          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

## 2015-05-22 ENCOUNTER — Ambulatory Visit: Payer: Self-pay

## 2015-05-24 ENCOUNTER — Telehealth: Payer: Self-pay | Admitting: *Deleted

## 2015-05-24 NOTE — Telephone Encounter (Signed)
Patient walked into clinic, unsure of when he was supposed to follow up.  Patient recently changed regimen due to autoimmune v drug induced hepatitis.  Last labs were 04/10/2015, office visit 12/14.  Patient will come back 2/15 for labs and 3/1 for follow up. Please enter future lab orders. Andree CossHowell, Michelle M, RN

## 2015-07-04 ENCOUNTER — Other Ambulatory Visit: Payer: Self-pay

## 2015-07-18 ENCOUNTER — Ambulatory Visit (INDEPENDENT_AMBULATORY_CARE_PROVIDER_SITE_OTHER): Payer: Self-pay | Admitting: Infectious Disease

## 2015-07-18 VITALS — Wt 190.0 lb

## 2015-07-18 DIAGNOSIS — K759 Inflammatory liver disease, unspecified: Secondary | ICD-10-CM

## 2015-07-18 DIAGNOSIS — D649 Anemia, unspecified: Secondary | ICD-10-CM

## 2015-07-18 DIAGNOSIS — B20 Human immunodeficiency virus [HIV] disease: Secondary | ICD-10-CM

## 2015-07-18 DIAGNOSIS — K716 Toxic liver disease with hepatitis, not elsewhere classified: Secondary | ICD-10-CM

## 2015-07-18 DIAGNOSIS — I10 Essential (primary) hypertension: Secondary | ICD-10-CM

## 2015-07-18 DIAGNOSIS — T50905A Adverse effect of unspecified drugs, medicaments and biological substances, initial encounter: Secondary | ICD-10-CM

## 2015-07-18 DIAGNOSIS — N528 Other male erectile dysfunction: Secondary | ICD-10-CM

## 2015-07-18 MED ORDER — LISINOPRIL 40 MG PO TABS: 40.0000 mg | ORAL_TABLET | Freq: Every day | ORAL | Status: DC

## 2015-07-18 NOTE — Progress Notes (Signed)
Chief complaint: followup for HIV on medications  Subjective:    Patient ID: [name] [name], male    DOB: Jul 10, 1970, 45 y.o.   MRN: [id number]  HPI   28 o. male who had been doing superbly well on his antiviral regimen TRIUMEQ but whom we changed to Tivicay and Descovy due to concerns for drug induced liver toxicity from Abacavir (note in the past he also had elevated LFTs which was blamed on Sustiva at that time)   Lab Results  Component Value Date   HIV1RNAQUANT <20 04/10/2015   Lab Results  Component Value Date   CD4TABS 1320 04/10/2015   CD4TABS 1280 08/15/2014   CD4TABS 1260 05/24/2014    He has noticed that since he has been on his HCTZ/ACEI he has suffered from erectile dysfunction. He notices that when he stops this BP medicine his ED resolves. I suspect this might be due to the HCTZ.   Past Medical History  Diagnosis Date  . HIV infection ([facility])   . Hypertension   . Transaminitis   . 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  No family history on file.    Social History   Social History  . Marital Status: Single    Spouse Name: N/A  . Number of Children: N/A  . Years of Education: N/A   Social History Main Topics  . Smoking status: Never Smoker   . Smokeless tobacco: Never Used  . Alcohol Use: 3.0 oz/week    5 Standard drinks or equivalent per week  . Drug Use: No  . Sexual Activity:    Partners: Male   Other Topics Concern  . Not on file   Social History Narrative    Allergies  Allergen Reactions  . Sustiva [Efavirenz] Other (See Comments)    ? hepatitis     Current outpatient prescriptions:  .  dolutegravir (TIVICAY) 50 MG tablet, Take 1 tablet (50 mg total) by mouth daily., Disp: 30 tablet, Rfl: 11 .  emtricitabine-tenofovir AF (DESCOVY) 200-25 MG tablet, Take 1 tablet by mouth daily., Disp: 30 tablet, Rfl: 11 .  valACYclovir (VALTREX) 1000 MG tablet, Take 0.5 tablets (500 mg total) by mouth daily., Disp:  30 tablet, Rfl: 11 .  lisinopril (PRINIVIL,ZESTRIL) 40 MG tablet, Take 1 tablet (40 mg total) by mouth daily., Disp: 30 tablet, Rfl: 11        Review of Systems  Constitutional: Negative for fever, chills, diaphoresis, activity change, appetite change, fatigue and unexpected weight change.  HENT: Negative for congestion, rhinorrhea, sinus pressure, sneezing, sore throat and trouble swallowing.   Eyes: Negative for photophobia and visual disturbance.  Respiratory: Negative for cough, chest tightness, shortness of breath, wheezing and stridor.   Cardiovascular: Negative for chest pain, palpitations and leg swelling.  Gastrointestinal: Negative for nausea, vomiting, abdominal pain, diarrhea, constipation, blood in stool, abdominal distention and anal bleeding.  Genitourinary: Negative for dysuria, hematuria, flank pain and difficulty urinating.  Musculoskeletal: Negative for myalgias, back pain, joint swelling, arthralgias and gait problem.  Skin: Negative for color change, pallor, rash and wound.  Neurological: Negative for dizziness, tremors, weakness and light-headedness.  Hematological: Negative for adenopathy. Does not bruise/bleed easily.  Psychiatric/Behavioral: Negative for behavioral problems, confusion, sleep disturbance, dysphoric mood, decreased concentration and agitation.       Objective:   Physical Exam  Constitutional: He is oriented to person, place, and time. He appears well-developed and well-nourished.  HENT:  Head: Normocephalic and atraumatic.  Eyes: Conjunctivae  and EOM are normal.  Neck: Normal range of motion. Neck supple.  Cardiovascular: Normal rate and regular rhythm.   Pulmonary/Chest: Effort normal. No respiratory distress. He has no wheezes.  Abdominal: Soft. He exhibits no distension. There is no tenderness. There is no rebound.  Musculoskeletal: Normal range of motion. He exhibits no edema or tenderness.  Neurological: He is alert and oriented to  person, place, and time.  Skin: Skin is warm and dry. No rash noted. No erythema. No pallor.  Psychiatric: He has a normal mood and affect. His behavior is normal. Judgment and thought content normal.          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 He is also supposed to be followed by Dr Nehemiah Settle for PCP  We will bring back for BP check and BMP in a week  I spent greater than 25 minutes with the patient including greater than 50% of time in face to face counsel of the patient re his HIV, HTN and possible drug induced hepatitis, ED,  and in coordination of his care.

## 2015-07-26 ENCOUNTER — Encounter (INDEPENDENT_AMBULATORY_CARE_PROVIDER_SITE_OTHER): Payer: Self-pay | Admitting: *Deleted

## 2015-07-26 ENCOUNTER — Other Ambulatory Visit (INDEPENDENT_AMBULATORY_CARE_PROVIDER_SITE_OTHER): Payer: Self-pay

## 2015-07-26 ENCOUNTER — Other Ambulatory Visit: Payer: Self-pay | Admitting: *Deleted

## 2015-07-26 VITALS — BP 145/82 | HR 61 | Temp 97.3°F | Resp 16 | Ht 69.0 in | Wt 194.8 lb

## 2015-07-26 DIAGNOSIS — Z006 Encounter for examination for normal comparison and control in clinical research program: Secondary | ICD-10-CM

## 2015-07-26 DIAGNOSIS — B20 Human immunodeficiency virus [HIV] disease: Secondary | ICD-10-CM

## 2015-07-26 LAB — CBC WITH DIFFERENTIAL/PLATELET
BASOS PCT: 1 % (ref 0–1)
Basophils Absolute: 0.1 10*3/uL (ref 0.0–0.1)
EOS ABS: 0.1 10*3/uL (ref 0.0–0.7)
EOS PCT: 2 % (ref 0–5)
HCT: 38.5 % — ABNORMAL LOW (ref 39.0–52.0)
Hemoglobin: 12.1 g/dL — ABNORMAL LOW (ref 13.0–17.0)
Lymphocytes Relative: 38 % (ref 12–46)
Lymphs Abs: 2.7 10*3/uL (ref 0.7–4.0)
MCH: 21.9 pg — AB (ref 26.0–34.0)
MCHC: 31.4 g/dL (ref 30.0–36.0)
MCV: 69.6 fL — ABNORMAL LOW (ref 78.0–100.0)
MONO ABS: 0.4 10*3/uL (ref 0.1–1.0)
MONOS PCT: 6 % (ref 3–12)
MPV: 10.5 fL (ref 8.6–12.4)
NEUTROS ABS: 3.8 10*3/uL (ref 1.7–7.7)
Neutrophils Relative %: 53 % (ref 43–77)
PLATELETS: 208 10*3/uL (ref 150–400)
RBC: 5.53 MIL/uL (ref 4.22–5.81)
RDW: 15.5 % (ref 11.5–15.5)
WBC: 7.1 10*3/uL (ref 4.0–10.5)

## 2015-07-26 LAB — COMPLETE METABOLIC PANEL WITH GFR
ALBUMIN: 4.1 g/dL (ref 3.6–5.1)
ALK PHOS: 69 U/L (ref 40–115)
ALT: 12 U/L (ref 9–46)
AST: 13 U/L (ref 10–40)
BUN: 11 mg/dL (ref 7–25)
CHLORIDE: 103 mmol/L (ref 98–110)
CO2: 27 mmol/L (ref 20–31)
Calcium: 9.5 mg/dL (ref 8.6–10.3)
Creat: 1.12 mg/dL (ref 0.60–1.35)
GFR, EST NON AFRICAN AMERICAN: 79 mL/min (ref >=60)
GFR, Est African American: >89 mL/min (ref >=60)
GLUCOSE: 99 mg/dL (ref 65–99)
POTASSIUM: 4.4 mmol/L (ref 3.5–5.3)
SODIUM: 142 mmol/L (ref 135–146)
Total Bilirubin: 0.5 mg/dL (ref 0.2–1.2)
Total Protein: 6.7 g/dL (ref 6.1–8.1)

## 2015-07-26 LAB — LIPID PANEL
CHOL/HDL RATIO: 2.8 ratio (ref <=5.0)
Cholesterol: 166 mg/dL (ref 125–200)
HDL: 60 mg/dL (ref >=40)
LDL CALC: 91 mg/dL (ref <130)
TRIGLYCERIDES: 75 mg/dL (ref <150)
VLDL: 15 mg/dL (ref <30)

## 2015-07-26 NOTE — Progress Notes (Signed)
[name] [name] here today for screening visit, [phone]: A Randomized Trial to Prevent Vascular Events in HIV (The REPRIEVE Study).  Will schedule entry visit once eligibility determined.

## 2015-07-26 NOTE — Addendum Note (Signed)
Addended by: Mariea ClontsGREEN, Mikinzie Maciejewski D on: 07/26/2015 11:41 AM   Modules accepted: Orders

## 2015-07-27 LAB — URINE CYTOLOGY ANCILLARY ONLY
CHLAMYDIA, DNA PROBE: NEGATIVE
NEISSERIA GONORRHEA: NEGATIVE

## 2015-07-27 LAB — T-HELPER CELL (CD4) - (RCID CLINIC ONLY)
CD4 % Helper T Cell: 46 % (ref 33–55)
CD4 T CELL ABS: 1130 /uL (ref 400–2700)

## 2015-07-27 LAB — RPR

## 2015-07-29 LAB — HIV-1 RNA QUANT-NO REFLEX-BLD

## 2015-08-02 ENCOUNTER — Encounter (INDEPENDENT_AMBULATORY_CARE_PROVIDER_SITE_OTHER): Payer: Self-pay | Admitting: *Deleted

## 2015-08-02 VITALS — BP 125/79 | HR 49 | Temp 97.6°F | Resp 16 | Wt 195.8 lb

## 2015-08-02 DIAGNOSIS — Z006 Encounter for examination for normal comparison and control in clinical research program: Secondary | ICD-10-CM

## 2015-08-02 NOTE — Progress Notes (Signed)
Romeo AppleBen here today for entry visit for [phone] A Randomized Trial to Prevent Vascular Events in HIV (The REPRIEVE Study). He was randomized to Pitavastatin 4mg  or placebo and will start medication today. Reviewed possible side effects. Instructed to call if he develops any adverse effects including rash, muscles aches and/or weakness. Verbalized understanding. He did complain of lightheadedness with position changes today. Notified Dr. Daiva EvesVan Dam and instructed patient to follow-up with primary MD Dr. Nehemiah SettlePolite for management of his BP medication. Instructed him to avoid sudden position changes to help with symptoms. Next visit scheduled for 09/03/15 at 3:00pm.

## 2015-08-03 ENCOUNTER — Other Ambulatory Visit: Payer: Self-pay | Admitting: Licensed Clinical Social Worker

## 2015-08-03 ENCOUNTER — Telehealth: Payer: Self-pay | Admitting: Licensed Clinical Social Worker

## 2015-08-03 MED ORDER — LISINOPRIL 20 MG PO TABS: 20.0000 mg | ORAL_TABLET | Freq: Every day | ORAL | Status: DC

## 2015-08-03 NOTE — Telephone Encounter (Signed)
Patient called stating that he is currently uninsured and can't see his primary right now, patient wanted to know since his blood pressure too low on the lisinopril 40 mg(see research note) could he drop down to 20 mg. Ok per Dr. Daiva EvesVan Dam. Advised patient to come in for a blood pressure in about a week or to check it on his own and call us. He agreed with this plan.

## 2015-10-19 ENCOUNTER — Encounter (INDEPENDENT_AMBULATORY_CARE_PROVIDER_SITE_OTHER): Payer: Self-pay | Admitting: *Deleted

## 2015-10-19 VITALS — BP 116/71 | HR 56 | Temp 97.4°F | Resp 16 | Wt 196.5 lb

## 2015-10-19 DIAGNOSIS — Z006 Encounter for examination for normal comparison and control in clinical research program: Secondary | ICD-10-CM

## 2015-10-19 NOTE — Progress Notes (Signed)
[name] [name] is here for [phone] study discontinuation visit.   States that he stopped taking his medication in April because he did not like the way it made him feel. States that he had intermittent nausea which he just could not tolerate. Denied any vomiting or diarrhea. Stated that the nausea resolved as soon as he stopped the study medication. He also recently started a new job. Stated that this is why he missed his month 1 visit. He has decided not to continue to participate in the study due to the side effects and is here today for his final/premature discontinuation visit. He did not bring study medication/pill bottles to visit. 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

## 2015-11-28 ENCOUNTER — Other Ambulatory Visit (INDEPENDENT_AMBULATORY_CARE_PROVIDER_SITE_OTHER): Payer: Self-pay

## 2015-11-28 ENCOUNTER — Ambulatory Visit: Payer: Self-pay

## 2015-11-28 DIAGNOSIS — B20 Human immunodeficiency virus [HIV] disease: Secondary | ICD-10-CM

## 2015-11-28 LAB — CBC WITH DIFFERENTIAL/PLATELET
BASOS ABS: 0 {cells}/uL (ref 0–200)
Basophils Relative: 0 %
EOS PCT: 1 %
Eosinophils Absolute: 74 cells/uL (ref 15–500)
HCT: 36.4 % — ABNORMAL LOW (ref 38.5–50.0)
HEMOGLOBIN: 11.6 g/dL — AB (ref 13.2–17.1)
LYMPHS ABS: 2368 {cells}/uL (ref 850–3900)
Lymphocytes Relative: 32 %
MCH: 22.2 pg — ABNORMAL LOW (ref 27.0–33.0)
MCHC: 31.9 g/dL — ABNORMAL LOW (ref 32.0–36.0)
MCV: 69.7 fL — ABNORMAL LOW (ref 80.0–100.0)
MONO ABS: 444 {cells}/uL (ref 200–950)
MPV: 10.3 fL (ref 7.5–12.5)
Monocytes Relative: 6 %
NEUTROS ABS: 4514 {cells}/uL (ref 1500–7800)
Neutrophils Relative %: 61 %
Platelets: 206 10*3/uL (ref 140–400)
RBC: 5.22 MIL/uL (ref 4.20–5.80)
RDW: 16.4 % — ABNORMAL HIGH (ref 11.0–15.0)
WBC: 7.4 10*3/uL (ref 3.8–10.8)

## 2015-11-28 NOTE — Addendum Note (Signed)
Addended byJimmy Picket: [name] [name], [name] [name] on: 11/28/2015 05:04 PM   Modules accepted: Orders

## 2015-11-29 ENCOUNTER — Encounter: Payer: Self-pay | Admitting: Infectious Disease

## 2015-11-29 LAB — BASIC METABOLIC PANEL WITH GFR
BUN: 15 mg/dL (ref 7–25)
CALCIUM: 9.1 mg/dL (ref 8.6–10.3)
CHLORIDE: 106 mmol/L (ref 98–110)
CO2: 22 mmol/L (ref 20–31)
Creat: 1.23 mg/dL (ref 0.60–1.35)
GFR, EST AFRICAN AMERICAN: 81 mL/min (ref >=60)
GFR, EST NON AFRICAN AMERICAN: 70 mL/min (ref >=60)
GLUCOSE: 85 mg/dL (ref 65–99)
POTASSIUM: 4 mmol/L (ref 3.5–5.3)
Sodium: 139 mmol/L (ref 135–146)

## 2015-11-30 LAB — HIV-1 RNA QUANT-NO REFLEX-BLD
HIV 1 RNA Quant: 65 copies/mL — ABNORMAL HIGH (ref <20)
HIV-1 RNA QUANT, LOG: 1.81 {Log_copies}/mL — AB (ref <1.30)

## 2015-11-30 LAB — T-HELPER CELL (CD4) - (RCID CLINIC ONLY)
CD4 % Helper T Cell: 46 % (ref 33–55)
CD4 T CELL ABS: 1060 /uL (ref 400–2700)

## 2015-12-11 ENCOUNTER — Encounter: Payer: Self-pay | Admitting: Infectious Disease

## 2015-12-11 ENCOUNTER — Ambulatory Visit (INDEPENDENT_AMBULATORY_CARE_PROVIDER_SITE_OTHER): Payer: Self-pay | Admitting: Infectious Disease

## 2015-12-11 VITALS — BP 130/80 | HR 66 | Temp 98.1°F | Ht 68.0 in | Wt 195.0 lb

## 2015-12-11 DIAGNOSIS — K759 Inflammatory liver disease, unspecified: Secondary | ICD-10-CM

## 2015-12-11 DIAGNOSIS — I10 Essential (primary) hypertension: Secondary | ICD-10-CM

## 2015-12-11 DIAGNOSIS — T50905A Adverse effect of unspecified drugs, medicaments and biological substances, initial encounter: Secondary | ICD-10-CM

## 2015-12-11 DIAGNOSIS — B20 Human immunodeficiency virus [HIV] disease: Secondary | ICD-10-CM

## 2015-12-11 DIAGNOSIS — K716 Toxic liver disease with hepatitis, not elsewhere classified: Secondary | ICD-10-CM

## 2015-12-11 NOTE — Progress Notes (Signed)
Chief complaint: followup for HIV on medications  Subjective:    Patient ID: [name] [name], male    DOB: 1970-06-28, 45 y.o.   MRN: [id number]  HPI   37 o. male who had been doing superbly well on his antiviral regimen TRIUMEQ but whom we changed to Tivicay and Descovy due to concerns for drug induced liver toxicity from Abacavir (note in the past he also had elevated LFTs which was blamed on Sustiva at that time)   Lab Results  Component Value Date   HIV1RNAQUANT 65 (H) 11/28/2015   HIV1RNAQUANT <20 07/26/2015   HIV1RNAQUANT <20 04/10/2015      Lab Results  Component Value Date   CD4TABS 1,060 11/28/2015   CD4TABS 1,130 07/26/2015   CD4TABS 1,320 04/10/2015       Past Medical History:  Diagnosis Date  . 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 HIV infection (HCC)   . Hypertension   . Transaminitis     No past surgical history on file.  No family history on file.    Social History   Social History  . Marital status: Single    Spouse name: N/A  . Number of children: N/A  . Years of education: N/A   Social History Main Topics  . Smoking status: Never Smoker  . Smokeless tobacco: Never Used  . Alcohol use None  . Drug use: Unknown  . Sexual activity: Not Asked   Other Topics Concern  . None   Social History Narrative  . None    Allergies  Allergen Reactions  . Sustiva [Efavirenz] Other (See Comments)    ? hepatitis     Current Outpatient Prescriptions:  .  dolutegravir (TIVICAY) 50 MG tablet, Take 1 tablet (50 mg total) by mouth daily., Disp: 30 tablet, Rfl: 11 .  emtricitabine-tenofovir AF (DESCOVY) 200-25 MG tablet, Take 1 tablet by mouth daily., Disp: 30 tablet, Rfl: 11 .  lisinopril (PRINIVIL,ZESTRIL) 20 MG tablet, Take 1 tablet (20 mg total) by mouth daily., Disp: 30 tablet, Rfl: 11        Review of Systems  Constitutional: Negative for activity change, appetite change, chills, diaphoresis, fatigue, fever and unexpected weight change.   HENT: Negative for congestion, rhinorrhea, sinus pressure, sneezing, sore throat and trouble swallowing.   Eyes: Negative for photophobia and visual disturbance.  Respiratory: Negative for cough, chest tightness, shortness of breath, wheezing and stridor.   Cardiovascular: Negative for chest pain, palpitations and leg swelling.  Gastrointestinal: Negative for abdominal distention, abdominal pain, anal bleeding, blood in stool, constipation, diarrhea, nausea and vomiting.  Genitourinary: Negative for difficulty urinating, dysuria, flank pain and hematuria.  Musculoskeletal: Negative for arthralgias, back pain, gait problem, joint swelling and myalgias.  Skin: Negative for color change, pallor, rash and wound.  Neurological: Negative for dizziness, tremors, weakness and light-headedness.  Hematological: Negative for adenopathy. Does not bruise/bleed easily.  Psychiatric/Behavioral: Negative for agitation, behavioral problems, confusion, decreased concentration, dysphoric mood and sleep disturbance.       Objective:   Physical Exam  Constitutional: He is oriented to person, place, and time. He appears well-developed and well-nourished.  HENT:  Head: Normocephalic and atraumatic.  Eyes: Conjunctivae and EOM are normal.  Neck: Normal range of motion. Neck supple.  Cardiovascular: Normal rate and regular rhythm.   Pulmonary/Chest: Effort normal. No respiratory distress. He has no wheezes.  Abdominal: Soft. He exhibits no distension. There is no tenderness. There is no rebound.  Musculoskeletal: Normal range of motion. He exhibits no edema or  tenderness.  Neurological: He is alert and oriented to person, place, and time.  Skin: Skin is warm and dry. No rash noted. No erythema. No pallor.  Psychiatric: He has a normal mood and affect. His behavior is normal. Judgment and thought content normal.          Assessment & Plan:   HIV: continue Tivicay and Descovy  Autoimmune vs drug induced  hepatitis:  LFTS have resolved since having change to Tivicay and Descovy  HTN: continue  lisinopril to 40mg . He is also supposed to be followed by Dr [name] [name] for PCP  We will bring back for BP check and BMP in a week  I spent greater than 25 minutes with the patient including greater than 50% of time in face to face counsel of the patient re his HIV, HTN and possible drug induced hepatitis, labs and in coordination of his care.

## 2016-02-22 ENCOUNTER — Other Ambulatory Visit: Payer: Self-pay | Admitting: Infectious Disease

## 2016-02-22 DIAGNOSIS — B20 Human immunodeficiency virus [HIV] disease: Secondary | ICD-10-CM

## 2016-03-18 ENCOUNTER — Ambulatory Visit: Payer: Self-pay

## 2016-03-20 ENCOUNTER — Other Ambulatory Visit: Payer: Self-pay | Admitting: Infectious Diseases

## 2016-03-20 ENCOUNTER — Ambulatory Visit: Payer: Self-pay

## 2016-03-20 DIAGNOSIS — B20 Human immunodeficiency virus [HIV] disease: Secondary | ICD-10-CM

## 2016-04-17 ENCOUNTER — Ambulatory Visit: Payer: BC Managed Care – PPO

## 2016-05-07 ENCOUNTER — Other Ambulatory Visit: Payer: Self-pay | Admitting: Infectious Disease

## 2016-05-07 DIAGNOSIS — B029 Zoster without complications: Secondary | ICD-10-CM

## 2016-05-14 ENCOUNTER — Other Ambulatory Visit: Payer: Self-pay

## 2016-05-14 MED ORDER — LISINOPRIL 20 MG PO TABS
20.0000 mg | ORAL_TABLET | Freq: Every day | ORAL | 11 refills | Status: DC
Start: 2016-05-14 — End: 2016-05-15

## 2016-05-15 ENCOUNTER — Other Ambulatory Visit: Payer: Self-pay | Admitting: *Deleted

## 2016-05-15 DIAGNOSIS — I10 Essential (primary) hypertension: Secondary | ICD-10-CM

## 2016-05-15 DIAGNOSIS — B20 Human immunodeficiency virus [HIV] disease: Secondary | ICD-10-CM

## 2016-05-15 MED ORDER — DOLUTEGRAVIR SODIUM 50 MG PO TABS: ORAL_TABLET | ORAL | 1 refills | Status: DC

## 2016-05-15 MED ORDER — LISINOPRIL 20 MG PO TABS: 20.0000 mg | ORAL_TABLET | Freq: Every day | ORAL | 3 refills | Status: DC

## 2016-05-15 MED ORDER — EMTRICITABINE-TENOFOVIR AF 200-25 MG PO TABS: 1.0000 | ORAL_TABLET | Freq: Every day | ORAL | 1 refills | Status: DC

## 2016-05-26 ENCOUNTER — Other Ambulatory Visit: Payer: BC Managed Care – PPO

## 2016-05-26 DIAGNOSIS — B20 Human immunodeficiency virus [HIV] disease: Secondary | ICD-10-CM

## 2016-05-27 LAB — COMPLETE METABOLIC PANEL WITH GFR
ALT: 11 U/L (ref 9–46)
AST: 12 U/L (ref 10–40)
Albumin: 4.4 g/dL (ref 3.6–5.1)
Alkaline Phosphatase: 74 U/L (ref 40–115)
BUN: 12 mg/dL (ref 7–25)
CHLORIDE: 105 mmol/L (ref 98–110)
CO2: 29 mmol/L (ref 20–31)
Calcium: 9.4 mg/dL (ref 8.6–10.3)
Creat: 1.12 mg/dL (ref 0.60–1.35)
GFR, EST NON AFRICAN AMERICAN: 78 mL/min (ref >=60)
GLUCOSE: 98 mg/dL (ref 65–99)
POTASSIUM: 4 mmol/L (ref 3.5–5.3)
SODIUM: 141 mmol/L (ref 135–146)
Total Bilirubin: 0.3 mg/dL (ref 0.2–1.2)
Total Protein: 6.9 g/dL (ref 6.1–8.1)

## 2016-05-27 LAB — CBC WITH DIFFERENTIAL/PLATELET
BASOS ABS: 0 {cells}/uL (ref 0–200)
BASOS PCT: 0 %
EOS ABS: 158 {cells}/uL (ref 15–500)
EOS PCT: 2 %
HCT: 38.8 % (ref 38.5–50.0)
Hemoglobin: 12.1 g/dL — ABNORMAL LOW (ref 13.2–17.1)
LYMPHS PCT: 37 %
Lymphs Abs: 2923 cells/uL (ref 850–3900)
MCH: 22 pg — AB (ref 27.0–33.0)
MCHC: 31.2 g/dL — ABNORMAL LOW (ref 32.0–36.0)
MCV: 70.7 fL — AB (ref 80.0–100.0)
MPV: 10.5 fL (ref 7.5–12.5)
Monocytes Absolute: 395 cells/uL (ref 200–950)
Monocytes Relative: 5 %
NEUTROS ABS: 4424 {cells}/uL (ref 1500–7800)
Neutrophils Relative %: 56 %
PLATELETS: 201 10*3/uL (ref 140–400)
RBC: 5.49 MIL/uL (ref 4.20–5.80)
RDW: 16.6 % — AB (ref 11.0–15.0)
WBC: 7.9 10*3/uL (ref 3.8–10.8)

## 2016-05-27 LAB — LIPID PANEL
CHOL/HDL RATIO: 2.8 ratio (ref <5.0)
Cholesterol: 164 mg/dL (ref <200)
HDL: 58 mg/dL (ref >40)
LDL CALC: 79 mg/dL (ref <100)
Triglycerides: 136 mg/dL (ref <150)
VLDL: 27 mg/dL (ref <30)

## 2016-05-27 LAB — RPR

## 2016-05-28 LAB — T-HELPER CELL (CD4) - (RCID CLINIC ONLY)
CD4 % Helper T Cell: 45 % (ref 33–55)
CD4 T Cell Abs: 1340 /uL (ref 400–2700)

## 2016-05-30 LAB — HIV-1 RNA QUANT-NO REFLEX-BLD

## 2016-06-06 ENCOUNTER — Telehealth: Payer: Self-pay | Admitting: Infectious Disease

## 2016-06-06 NOTE — Telephone Encounter (Signed)
I called pt number and left VM re clinic being closed

## 2016-06-09 ENCOUNTER — Ambulatory Visit: Payer: Self-pay | Admitting: Infectious Disease

## 2016-06-18 ENCOUNTER — Encounter: Payer: Self-pay | Admitting: Infectious Disease

## 2016-06-18 ENCOUNTER — Ambulatory Visit (INDEPENDENT_AMBULATORY_CARE_PROVIDER_SITE_OTHER): Payer: BC Managed Care – PPO | Admitting: Infectious Disease

## 2016-06-18 VITALS — BP 131/87 | HR 67 | Temp 97.8°F | Ht 68.0 in | Wt 198.0 lb

## 2016-06-18 DIAGNOSIS — B20 Human immunodeficiency virus [HIV] disease: Secondary | ICD-10-CM | POA: Diagnosis not present

## 2016-06-18 DIAGNOSIS — T50905A Adverse effect of unspecified drugs, medicaments and biological substances, initial encounter: Secondary | ICD-10-CM

## 2016-06-18 DIAGNOSIS — I10 Essential (primary) hypertension: Secondary | ICD-10-CM

## 2016-06-18 DIAGNOSIS — K754 Autoimmune hepatitis: Secondary | ICD-10-CM

## 2016-06-18 DIAGNOSIS — Z23 Encounter for immunization: Secondary | ICD-10-CM | POA: Diagnosis not present

## 2016-06-18 DIAGNOSIS — R7401 Elevation of levels of liver transaminase levels: Secondary | ICD-10-CM

## 2016-06-18 DIAGNOSIS — K716 Toxic liver disease with hepatitis, not elsewhere classified: Secondary | ICD-10-CM

## 2016-06-18 DIAGNOSIS — R740 Nonspecific elevation of levels of transaminase and lactic acid dehydrogenase [LDH]: Secondary | ICD-10-CM | POA: Diagnosis not present

## 2016-06-18 MED ORDER — EMTRICITABINE-TENOFOVIR AF 200-25 MG PO TABS: 1.0000 | ORAL_TABLET | Freq: Every day | ORAL | 11 refills | Status: DC

## 2016-06-18 MED ORDER — DOLUTEGRAVIR SODIUM 50 MG PO TABS: ORAL_TABLET | ORAL | 11 refills | Status: DC

## 2016-06-18 MED ORDER — MENINGOCOCCAL A C Y&W-135 OLIG IM SOLR
0.5000 mL | Freq: Once | INTRAMUSCULAR | Status: AC
  Administered 2016-06-18: 0.5 mL via INTRAMUSCULAR

## 2016-06-18 NOTE — Progress Notes (Signed)
Chief complaint: followup for HIV on medications  Subjective:    Patient ID: [name] [name], male    DOB: 06-21-1970, 46 y.o.   MRN: [id number]  HPI  [age] o. male who had been doing superbly well on his antiviral regimen TRIUMEQ but whom we changed to Tivicay and Descovy due to concerns for drug induced liver toxicity from Abacavir (note in the past he also had elevated LFTs which was blamed on Sustiva at that time)   Lab Results  Component Value Date   HIV1RNAQUANT <20 05/26/2016   HIV1RNAQUANT 65 (H) 11/28/2015   HIV1RNAQUANT <20 07/26/2015      Lab Results  Component Value Date   CD4TABS 1,340 05/26/2016   CD4TABS 1,060 11/28/2015   CD4TABS 1,130 07/26/2015       Past Medical History:  Diagnosis Date  . 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 HIV infection (HCC)   . Hypertension   . Transaminitis     No past surgical history on file.  No family history on file.    Social History   Social History  . Marital status: Single    Spouse name: N/A  . Number of children: N/A  . Years of education: N/A   Social History Main Topics  . Smoking status: Never Smoker  . Smokeless tobacco: Never Used  . Alcohol use None  . Drug use: Unknown  . Sexual activity: Not Asked   Other Topics Concern  . None   Social History Narrative  . None    Allergies  Allergen Reactions  . Sustiva [Efavirenz] Other (See Comments)    ? hepatitis     Current Outpatient Prescriptions:  .  dolutegravir (TIVICAY) 50 MG tablet, TAKE 1 TABLET(50 MG) BY MOUTH DAILY, Disp: 90 tablet, Rfl: 1 .  emtricitabine-tenofovir AF (DESCOVY) 200-25 MG tablet, Take 1 tablet by mouth daily., Disp: 90 tablet, Rfl: 1 .  lisinopril (PRINIVIL,ZESTRIL) 20 MG tablet, Take 1 tablet (20 mg total) by mouth daily., Disp: 90 tablet, Rfl: 3 .  valACYclovir (VALTREX) 1000 MG tablet, TAKE 1/2 TABLET BY MOUTH EVERY DAY, Disp: 15 tablet, Rfl: 1        Review of Systems  Constitutional: Negative for activity  change, appetite change, chills, diaphoresis, fatigue, fever and unexpected weight change.  HENT: Negative for congestion, rhinorrhea, sinus pressure, sneezing, sore throat and trouble swallowing.   Eyes: Negative for photophobia and visual disturbance.  Respiratory: Negative for cough, chest tightness, shortness of breath, wheezing and stridor.   Cardiovascular: Negative for chest pain, palpitations and leg swelling.  Gastrointestinal: Negative for abdominal distention, abdominal pain, anal bleeding, blood in stool, constipation, diarrhea, nausea and vomiting.  Genitourinary: Negative for difficulty urinating, dysuria, flank pain and hematuria.  Musculoskeletal: Negative for arthralgias, back pain, gait problem, joint swelling and myalgias.  Skin: Negative for color change, pallor, rash and wound.  Neurological: Negative for dizziness, tremors, weakness and light-headedness.  Hematological: Negative for adenopathy. Does not bruise/bleed easily.  Psychiatric/Behavioral: Negative for agitation, behavioral problems, confusion, decreased concentration, dysphoric mood and sleep disturbance.       Objective:   Physical Exam  Constitutional: He is oriented to person, place, and time. He appears well-developed and well-nourished.  HENT:  Head: Normocephalic and atraumatic.  Eyes: Conjunctivae and EOM are normal.  Neck: Normal range of motion. Neck supple.  Cardiovascular: Normal rate and regular rhythm.   Pulmonary/Chest: Effort normal. No respiratory distress. He has no wheezes.  Abdominal: Soft. He exhibits no distension. There is no  tenderness. There is no rebound.  Musculoskeletal: Normal range of motion. He exhibits no edema or tenderness.  Neurological: He is alert and oriented to person, place, and time.  Skin: Skin is warm and dry. No rash noted. No erythema. No pallor.  Psychiatric: He has a normal mood and affect. His behavior is normal. Judgment and thought content normal.           Assessment & Plan:   HIV: continue Tivicay and Descovy  Autoimmune vs drug induced hepatitis:  LFTS have resolved since having change to Tivicay and Descovy  HTN: continue  lisinopril to 40mg . He is also supposed to be followed by Dr Nehemiah Settle for PCP    I spent greater than 25 minutes with the patient including greater than 50% of time in face to face counsel of the patient re his HIV, HTN and possible drug induced hepatitis, labs and in coordination of his care.

## 2016-07-11 ENCOUNTER — Other Ambulatory Visit: Payer: Self-pay | Admitting: *Deleted

## 2016-07-11 DIAGNOSIS — B029 Zoster without complications: Secondary | ICD-10-CM

## 2016-07-11 MED ORDER — VALACYCLOVIR HCL 1 G PO TABS: 500.0000 mg | ORAL_TABLET | Freq: Every day | ORAL | 1 refills | Status: DC

## 2016-07-12 ENCOUNTER — Other Ambulatory Visit: Payer: Self-pay | Admitting: Infectious Disease

## 2016-07-12 DIAGNOSIS — B029 Zoster without complications: Secondary | ICD-10-CM

## 2016-09-03 ENCOUNTER — Other Ambulatory Visit: Payer: Self-pay | Admitting: Infectious Disease

## 2016-09-03 DIAGNOSIS — B20 Human immunodeficiency virus [HIV] disease: Secondary | ICD-10-CM

## 2016-12-09 ENCOUNTER — Other Ambulatory Visit: Payer: Self-pay | Admitting: Infectious Disease

## 2016-12-09 DIAGNOSIS — B029 Zoster without complications: Secondary | ICD-10-CM

## 2017-01-03 ENCOUNTER — Encounter: Payer: Self-pay | Admitting: Infectious Disease

## 2017-01-29 ENCOUNTER — Ambulatory Visit (INDEPENDENT_AMBULATORY_CARE_PROVIDER_SITE_OTHER): Payer: BC Managed Care – PPO

## 2017-01-29 ENCOUNTER — Other Ambulatory Visit: Payer: BC Managed Care – PPO

## 2017-01-29 DIAGNOSIS — Z23 Encounter for immunization: Secondary | ICD-10-CM | POA: Diagnosis not present

## 2017-01-29 DIAGNOSIS — B20 Human immunodeficiency virus [HIV] disease: Secondary | ICD-10-CM

## 2017-01-30 ENCOUNTER — Other Ambulatory Visit: Payer: Self-pay | Admitting: Infectious Disease

## 2017-01-30 DIAGNOSIS — B029 Zoster without complications: Secondary | ICD-10-CM

## 2017-01-30 LAB — COMPLETE METABOLIC PANEL WITH GFR
AG Ratio: 1.9 (calc) (ref 1.0–2.5)
ALKALINE PHOSPHATASE (APISO): 72 U/L (ref 40–115)
ALT: 14 U/L (ref 9–46)
AST: 13 U/L (ref 10–40)
Albumin: 4.5 g/dL (ref 3.6–5.1)
BILIRUBIN TOTAL: 0.4 mg/dL (ref 0.2–1.2)
BUN: 12 mg/dL (ref 7–25)
CHLORIDE: 104 mmol/L (ref 98–110)
CO2: 26 mmol/L (ref 20–32)
CREATININE: 1.28 mg/dL (ref 0.60–1.35)
Calcium: 9.4 mg/dL (ref 8.6–10.3)
GFR, Est African American: 77 mL/min/{1.73_m2} (ref >=60)
GFR, Est Non African American: 66 mL/min/{1.73_m2} (ref >=60)
GLOBULIN: 2.4 g/dL (ref 1.9–3.7)
Glucose, Bld: 100 mg/dL — ABNORMAL HIGH (ref 65–99)
Potassium: 4.5 mmol/L (ref 3.5–5.3)
Sodium: 139 mmol/L (ref 135–146)
Total Protein: 6.9 g/dL (ref 6.1–8.1)

## 2017-01-30 LAB — CBC WITH DIFFERENTIAL/PLATELET
Basophils Absolute: 31 cells/uL (ref 0–200)
Basophils Relative: 0.5 %
Eosinophils Absolute: 79 cells/uL (ref 15–500)
Eosinophils Relative: 1.3 %
HEMATOCRIT: 38.3 % — AB (ref 38.5–50.0)
Hemoglobin: 12.2 g/dL — ABNORMAL LOW (ref 13.2–17.1)
LYMPHS ABS: 2117 {cells}/uL (ref 850–3900)
MCH: 22 pg — ABNORMAL LOW (ref 27.0–33.0)
MCHC: 31.9 g/dL — ABNORMAL LOW (ref 32.0–36.0)
MCV: 69 fL — ABNORMAL LOW (ref 80.0–100.0)
MPV: 11.8 fL (ref 7.5–12.5)
Monocytes Relative: 6.6 %
NEUTROS ABS: 3471 {cells}/uL (ref 1500–7800)
Neutrophils Relative %: 56.9 %
Platelets: 216 10*3/uL (ref 140–400)
RBC: 5.55 10*6/uL (ref 4.20–5.80)
RDW: 16 % — AB (ref 11.0–15.0)
Total Lymphocyte: 34.7 %
WBC: 6.1 10*3/uL (ref 3.8–10.8)
WBCMIX: 403 {cells}/uL (ref 200–950)

## 2017-01-30 LAB — RPR: RPR: NONREACTIVE

## 2017-01-30 LAB — T-HELPER CELL (CD4) - (RCID CLINIC ONLY)
CD4 T CELL ABS: 1010 /uL (ref 400–2700)
CD4 T CELL HELPER: 46 % (ref 33–55)

## 2017-02-02 LAB — HIV-1 RNA QUANT-NO REFLEX-BLD
HIV 1 RNA QUANT: NOT DETECTED {copies}/mL
HIV-1 RNA QUANT, LOG: NOT DETECTED {Log_copies}/mL

## 2017-02-09 ENCOUNTER — Ambulatory Visit (INDEPENDENT_AMBULATORY_CARE_PROVIDER_SITE_OTHER): Payer: BC Managed Care – PPO | Admitting: Infectious Disease

## 2017-02-09 ENCOUNTER — Encounter: Payer: Self-pay | Admitting: Infectious Disease

## 2017-02-09 VITALS — BP 126/82 | HR 55 | Temp 98.2°F | Wt 208.0 lb

## 2017-02-09 DIAGNOSIS — I10 Essential (primary) hypertension: Secondary | ICD-10-CM | POA: Insufficient documentation

## 2017-02-09 DIAGNOSIS — Z23 Encounter for immunization: Secondary | ICD-10-CM | POA: Diagnosis not present

## 2017-02-09 DIAGNOSIS — K716 Toxic liver disease with hepatitis, not elsewhere classified: Secondary | ICD-10-CM

## 2017-02-09 DIAGNOSIS — R739 Hyperglycemia, unspecified: Secondary | ICD-10-CM

## 2017-02-09 DIAGNOSIS — B20 Human immunodeficiency virus [HIV] disease: Secondary | ICD-10-CM | POA: Diagnosis not present

## 2017-02-09 DIAGNOSIS — T50905A Adverse effect of unspecified drugs, medicaments and biological substances, initial encounter: Secondary | ICD-10-CM

## 2017-02-09 DIAGNOSIS — Z113 Encounter for screening for infections with a predominantly sexual mode of transmission: Secondary | ICD-10-CM | POA: Diagnosis not present

## 2017-02-09 MED ORDER — DOLUTEGRAVIR SODIUM 50 MG PO TABS: ORAL_TABLET | ORAL | 3 refills | Status: DC

## 2017-02-09 MED ORDER — EMTRICITABINE-TENOFOVIR AF 200-25 MG PO TABS
ORAL_TABLET | ORAL | 3 refills | Status: DC
Start: 2017-02-09 — End: 2017-06-08

## 2017-02-09 MED ORDER — EMTRICITABINE-TENOFOVIR AF 200-25 MG PO TABS: 1.0000 | ORAL_TABLET | Freq: Every day | ORAL | 11 refills | Status: DC

## 2017-02-09 NOTE — Progress Notes (Signed)
Chief complaint: followup for HIV on medications  Subjective:    Patient ID: [name] [name], male    DOB: 1971/04/15, 46 y.o.   MRN: [id number]  HPI  69  Old male with HIV that is doing very well  on TIVICAY and DESCOVY. His transaminitis has resolved since changing him off of TRIUMEQ.  He was a bit anxious that his CD4 count had dropped minimally. I again reviewed with him exhaustively the fact that his viral load remains below 20 and when it is not this range cannot affect his CD4 cells and what he is observing is simply the natural variation in day-to-day month-to-month fluctuations and white blood cells.   Lab Results  Component Value Date   HIV1RNAQUANT <20 NOT DETECTED 01/29/2017   HIV1RNAQUANT <20 05/26/2016   HIV1RNAQUANT 65 (H) 11/28/2015      Lab Results  Component Value Date   CD4TABS 1,010 01/29/2017   CD4TABS 1,340 05/26/2016   CD4TABS 1,060 11/28/2015       Past Medical History:  Diagnosis Date  . 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 HIV infection (HCC)   . Hypertension   . Transaminitis     No past surgical history on file.  No family history on file.    Social History   Social History  . Marital status: Single    Spouse name: N/A  . Number of children: N/A  . Years of education: N/A   Social History Main Topics  . Smoking status: Never Smoker  . Smokeless tobacco: Never Used  . Alcohol use 3.0 oz/week    5 Standard drinks or equivalent per week  . Drug use: Unknown  . Sexual activity: Not Asked   Other Topics Concern  . None   Social History Narrative  . None    Allergies  Allergen Reactions  . Sustiva [Efavirenz] Other (See Comments)    ? hepatitis     Current Outpatient Prescriptions:  .  DESCOVY 200-25 MG tablet, TAKE ONE TABLET BY MOUTH ONCE DAILY WITH OR WITHOUT FOOD., Disp: 90 tablet, Rfl: 0 .  dolutegravir (TIVICAY) 50 MG tablet, TAKE 1 TABLET(50 MG) BY MOUTH DAILY, Disp: 30 tablet, Rfl: 11 .   emtricitabine-tenofovir AF (DESCOVY) 200-25 MG tablet, Take 1 tablet by mouth daily., Disp: 30 tablet, Rfl: 11 .  lisinopril (PRINIVIL,ZESTRIL) 20 MG tablet, Take 1 tablet (20 mg total) by mouth daily., Disp: 90 tablet, Rfl: 3 .  valACYclovir (VALTREX) 1000 MG tablet, Take 0.5 tablets (500 mg total) by mouth daily., Disp: 45 tablet, Rfl: 1 .  TIVICAY 50 MG tablet, TAKE ONE TABLET (50 MG) BY MOUTH ONCE DAILY. STORE AT ROOM TEMPERATURE., Disp: 90 tablet, Rfl: 0 .  valACYclovir (VALTREX) 1000 MG tablet, TAKE 1/2 TABLET BY MOUTH EVERY DAY, Disp: 15 tablet, Rfl: 5 .  valACYclovir (VALTREX) 1000 MG tablet, TAKE 1/2 TABLET BY MOUTH EVERY DAY, Disp: 15 tablet, Rfl: 3        Review of Systems  Constitutional: Negative for activity change, appetite change, chills, diaphoresis, fatigue, fever and unexpected weight change.  HENT: Negative for congestion, rhinorrhea, sinus pressure, sneezing, sore throat and trouble swallowing.   Eyes: Negative for photophobia and visual disturbance.  Respiratory: Negative for cough, chest tightness, shortness of breath, wheezing and stridor.   Cardiovascular: Negative for chest pain, palpitations and leg swelling.  Gastrointestinal: Negative for abdominal distention, abdominal pain, anal bleeding, blood in stool, constipation, diarrhea, nausea and vomiting.  Genitourinary: Negative for difficulty urinating, dysuria, flank pain and  hematuria.  Musculoskeletal: Negative for arthralgias, back pain, gait problem, joint swelling and myalgias.  Skin: Negative for color change, pallor, rash and wound.  Neurological: Negative for dizziness, tremors, weakness and light-headedness.  Hematological: Negative for adenopathy. Does not bruise/bleed easily.  Psychiatric/Behavioral: Negative for agitation, behavioral problems, confusion, decreased concentration, dysphoric mood and sleep disturbance.       Objective:   Physical Exam  Constitutional: He is oriented to person, place,  and time. He appears well-developed and well-nourished.  HENT:  Head: Normocephalic and atraumatic.  Eyes: Conjunctivae and EOM are normal.  Neck: Normal range of motion. Neck supple.  Cardiovascular: Normal rate and regular rhythm.   Pulmonary/Chest: Effort normal. No respiratory distress. He has no wheezes.  Abdominal: Soft. He exhibits no distension. There is no tenderness. There is no rebound.  Musculoskeletal: Normal range of motion. He exhibits no edema or tenderness.  Neurological: He is alert and oriented to person, place, and time.  Skin: Skin is warm and dry. No rash noted. No erythema. No pallor.  Psychiatric: He has a normal mood and affect. His behavior is normal. Judgment and thought content normal.          Assessment & Plan:   HIV: continue Tivicay and Descovy, return to clinic in 1 year  Autoimmune vs drug induced hepatitis: Resolved Hyperglycmia: check A1c  HTN: continue  lisinopril to . He is also supposed to be followed by Dr Nehemiah Settle for PCP  I spent greater than 25 minutes with the patient including greater than 50% of time in face to face counsel of the patient guarding the nature of HIV infection how it affects CD4 cells cannot affect CD4 cells when remains below 20 copies, regarding the nature of HIV not being infectious sexually when it is below 200 copies reviewing natural variations in T-cell and white blood cell that occur in healthy individuals and in coordination of his care.

## 2017-02-10 LAB — HEMOGLOBIN A1C
EAG (MMOL/L): 7.3 (calc)
HEMOGLOBIN A1C: 6.2 %{Hb} — AB (ref <5.7)
Mean Plasma Glucose: 131 (calc)

## 2017-02-11 ENCOUNTER — Telehealth: Payer: Self-pay | Admitting: *Deleted

## 2017-02-11 NOTE — Telephone Encounter (Signed)
Patient notified and he said he would work on his diet. He has been eating a lot of candy bars and drinking soda. He will also work on losing weight.

## 2017-02-11 NOTE — Telephone Encounter (Signed)
-----   Message from Randall Hiss, MD sent at 02/10/2017 12:37 PM EDT ----- Lyon's A1c is in prediabetic range. If he can work on losing 10-15 pounds he can correct this. Otherwise if he gains further weight he will likely slide into DM and need metformin

## 2017-02-11 NOTE — Telephone Encounter (Signed)
Perfect

## 2017-06-08 ENCOUNTER — Other Ambulatory Visit: Payer: Self-pay | Admitting: *Deleted

## 2017-06-08 DIAGNOSIS — B029 Zoster without complications: Secondary | ICD-10-CM

## 2017-06-08 MED ORDER — VALACYCLOVIR HCL 1 G PO TABS: 500.0000 mg | ORAL_TABLET | Freq: Every day | ORAL | 5 refills | Status: DC

## 2017-06-11 ENCOUNTER — Other Ambulatory Visit: Payer: Self-pay | Admitting: Infectious Disease

## 2017-06-11 DIAGNOSIS — I10 Essential (primary) hypertension: Secondary | ICD-10-CM

## 2017-09-22 ENCOUNTER — Telehealth: Payer: Self-pay | Admitting: *Deleted

## 2017-09-22 NOTE — Telephone Encounter (Signed)
I need to understand from him WHY he needs to miss 1-2 days per month. I cant just say this without justification.   If he is in counselling or something for depression that is legitimate but having HIV itself is not sufficient since he and most of our patietns should be able to live FULLY active, functional lives not different from their peers unless there are comorbid medical problems such as depression, substance abuse, or difficult to treat medical problems such as DM or being on hemodialysis etc

## 2017-09-22 NOTE — Telephone Encounter (Signed)
Patient has new job, needs FMLA on file to cover any necessary absences. He is asking for 1-2 days per month as needed (continuing what he had at former job, but never actually needed).  Original forms in Dr Zenaida Niece Dam's box.  Per Sharlet Salina, ok to write HIV, but he wants to be called when the forms are complete - DO NOT FAX. He will pick up. Andree Coss, RN

## 2017-09-22 NOTE — Telephone Encounter (Signed)
I'll attach previous FMLA form for reference.

## 2017-09-22 NOTE — Telephone Encounter (Signed)
Thanks Michelle

## 2017-10-01 NOTE — Telephone Encounter (Signed)
Patient advised forms are ready for pick up and at front desk.

## 2017-12-01 ENCOUNTER — Other Ambulatory Visit: Payer: Self-pay | Admitting: Infectious Disease

## 2017-12-01 DIAGNOSIS — B029 Zoster without complications: Secondary | ICD-10-CM

## 2017-12-08 ENCOUNTER — Ambulatory Visit
Admission: RE | Admit: 2017-12-08 | Discharge: 2017-12-08 | Disposition: A | Discharge status: Home / Self Care | Payer: BC Managed Care – PPO | Source: Ambulatory Visit | Attending: Internal Medicine | Admitting: Internal Medicine

## 2017-12-08 ENCOUNTER — Other Ambulatory Visit: Payer: Self-pay | Admitting: Internal Medicine

## 2017-12-08 DIAGNOSIS — R319 Hematuria, unspecified: Secondary | ICD-10-CM

## 2018-01-04 ENCOUNTER — Other Ambulatory Visit: Payer: Self-pay | Admitting: Infectious Disease

## 2018-01-04 DIAGNOSIS — B20 Human immunodeficiency virus [HIV] disease: Secondary | ICD-10-CM

## 2018-01-26 ENCOUNTER — Other Ambulatory Visit: Payer: BC Managed Care – PPO

## 2018-01-26 ENCOUNTER — Other Ambulatory Visit (HOSPITAL_COMMUNITY)
Admission: RE | Admit: 2018-01-26 | Discharge: 2018-01-26 | Disposition: A | Discharge status: Home / Self Care | Payer: BC Managed Care – PPO | Source: Ambulatory Visit | Attending: Infectious Disease | Admitting: Infectious Disease

## 2018-01-26 DIAGNOSIS — I10 Essential (primary) hypertension: Secondary | ICD-10-CM | POA: Insufficient documentation

## 2018-01-26 DIAGNOSIS — R739 Hyperglycemia, unspecified: Secondary | ICD-10-CM | POA: Diagnosis present

## 2018-01-26 DIAGNOSIS — Z113 Encounter for screening for infections with a predominantly sexual mode of transmission: Secondary | ICD-10-CM | POA: Insufficient documentation

## 2018-01-26 DIAGNOSIS — B20 Human immunodeficiency virus [HIV] disease: Secondary | ICD-10-CM | POA: Diagnosis not present

## 2018-01-27 LAB — URINE CYTOLOGY ANCILLARY ONLY
CHLAMYDIA, DNA PROBE: NEGATIVE
NEISSERIA GONORRHEA: NEGATIVE

## 2018-01-27 LAB — T-HELPER CELL (CD4) - (RCID CLINIC ONLY)
CD4 T CELL HELPER: 44 % (ref 33–55)
CD4 T Cell Abs: 1070 /uL (ref 400–2700)

## 2018-01-28 LAB — COMPLETE METABOLIC PANEL WITH GFR
AG Ratio: 1.8 (calc) (ref 1.0–2.5)
ALBUMIN MSPROF: 4.4 g/dL (ref 3.6–5.1)
ALT: 10 U/L (ref 9–46)
AST: 12 U/L (ref 10–40)
Alkaline phosphatase (APISO): 96 U/L (ref 40–115)
BUN: 16 mg/dL (ref 7–25)
CALCIUM: 9.6 mg/dL (ref 8.6–10.3)
CO2: 22 mmol/L (ref 20–32)
CREATININE: 1.3 mg/dL (ref 0.60–1.35)
Chloride: 105 mmol/L (ref 98–110)
GFR, EST NON AFRICAN AMERICAN: 65 mL/min/{1.73_m2} (ref >=60)
GFR, Est African American: 75 mL/min/{1.73_m2} (ref >=60)
GLOBULIN: 2.5 g/dL (ref 1.9–3.7)
Glucose, Bld: 104 mg/dL — ABNORMAL HIGH (ref 65–99)
Potassium: 4.1 mmol/L (ref 3.5–5.3)
Sodium: 137 mmol/L (ref 135–146)
TOTAL PROTEIN: 6.9 g/dL (ref 6.1–8.1)
Total Bilirubin: 0.3 mg/dL (ref 0.2–1.2)

## 2018-01-28 LAB — CBC WITH DIFFERENTIAL/PLATELET
BASOS PCT: 0.4 %
Basophils Absolute: 27 cells/uL (ref 0–200)
EOS ABS: 67 {cells}/uL (ref 15–500)
Eosinophils Relative: 1 %
HEMATOCRIT: 37.2 % — AB (ref 38.5–50.0)
Hemoglobin: 11.7 g/dL — ABNORMAL LOW (ref 13.2–17.1)
Lymphs Abs: 2452 cells/uL (ref 850–3900)
MCH: 22 pg — ABNORMAL LOW (ref 27.0–33.0)
MCHC: 31.5 g/dL — ABNORMAL LOW (ref 32.0–36.0)
MCV: 69.9 fL — ABNORMAL LOW (ref 80.0–100.0)
MPV: 10.8 fL (ref 7.5–12.5)
Monocytes Relative: 7.3 %
NEUTROS ABS: 3665 {cells}/uL (ref 1500–7800)
Neutrophils Relative %: 54.7 %
Platelets: 197 10*3/uL (ref 140–400)
RBC: 5.32 10*6/uL (ref 4.20–5.80)
RDW: 15.9 % — ABNORMAL HIGH (ref 11.0–15.0)
Total Lymphocyte: 36.6 %
WBC: 6.7 10*3/uL (ref 3.8–10.8)
WBCMIX: 489 {cells}/uL (ref 200–950)

## 2018-01-28 LAB — LIPID PANEL
CHOLESTEROL: 152 mg/dL (ref <200)
HDL: 47 mg/dL (ref >40)
LDL Cholesterol (Calc): 84 mg/dL (calc)
Non-HDL Cholesterol (Calc): 105 mg/dL (calc) (ref <130)
Total CHOL/HDL Ratio: 3.2 (calc) (ref <5.0)
Triglycerides: 116 mg/dL (ref <150)

## 2018-01-28 LAB — RPR: RPR Ser Ql: NONREACTIVE

## 2018-01-28 LAB — HIV-1 RNA QUANT-NO REFLEX-BLD
HIV 1 RNA Quant: 26 copies/mL — ABNORMAL HIGH
HIV-1 RNA QUANT, LOG: 1.41 {Log_copies}/mL — AB

## 2018-02-10 ENCOUNTER — Ambulatory Visit: Payer: BC Managed Care – PPO | Admitting: Infectious Disease

## 2018-02-17 ENCOUNTER — Ambulatory Visit (INDEPENDENT_AMBULATORY_CARE_PROVIDER_SITE_OTHER): Payer: BC Managed Care – PPO

## 2018-02-17 DIAGNOSIS — Z23 Encounter for immunization: Secondary | ICD-10-CM

## 2018-02-26 ENCOUNTER — Other Ambulatory Visit: Payer: Self-pay | Admitting: Infectious Disease

## 2018-02-26 DIAGNOSIS — I10 Essential (primary) hypertension: Secondary | ICD-10-CM

## 2018-02-26 NOTE — Telephone Encounter (Signed)
Patient states he currently does not need a refill on medication. Advised patient to request PCP to manage and refill Lisinopril to better manager his chronic Hypertension. Patient understood.  S.Isao Seltzer, LPN

## 2018-03-31 ENCOUNTER — Ambulatory Visit: Payer: BC Managed Care – PPO | Admitting: Infectious Disease

## 2018-04-02 ENCOUNTER — Ambulatory Visit (INDEPENDENT_AMBULATORY_CARE_PROVIDER_SITE_OTHER): Payer: BC Managed Care – PPO | Admitting: Infectious Disease

## 2018-04-02 VITALS — BP 147/90 | HR 51 | Temp 98.3°F | Ht 68.0 in | Wt 214.0 lb

## 2018-04-02 DIAGNOSIS — K716 Toxic liver disease with hepatitis, not elsewhere classified: Secondary | ICD-10-CM

## 2018-04-02 DIAGNOSIS — Z23 Encounter for immunization: Secondary | ICD-10-CM | POA: Diagnosis not present

## 2018-04-02 DIAGNOSIS — K759 Inflammatory liver disease, unspecified: Secondary | ICD-10-CM | POA: Diagnosis not present

## 2018-04-02 DIAGNOSIS — B20 Human immunodeficiency virus [HIV] disease: Secondary | ICD-10-CM | POA: Diagnosis not present

## 2018-04-02 DIAGNOSIS — I10 Essential (primary) hypertension: Secondary | ICD-10-CM | POA: Diagnosis not present

## 2018-04-02 DIAGNOSIS — T50905A Adverse effect of unspecified drugs, medicaments and biological substances, initial encounter: Secondary | ICD-10-CM

## 2018-04-02 MED ORDER — BIKTARVY 50-200-25 MG PO TABS: 1.0000 | ORAL_TABLET | Freq: Every day | ORAL | 3 refills | Status: DC

## 2018-04-02 MED FILL — BIKTARVY 50-200-25 MG TABS: 50-200-25 | 90 days supply | Qty: 90 | Fill #0

## 2018-04-02 NOTE — Progress Notes (Signed)
Ok with receiving Prevnar

## 2018-04-02 NOTE — Progress Notes (Signed)
Chief complaint: followup for HIV on medications  Subjective:    Patient ID: [name] [name], male    DOB: 05-01-1971, 47 y.o.   MRN: [id number]  HPI   [age] Old male with HIV that is doing very well  on TIVICAY and DESCOVY.  We discussed different single tablet regimens he could switch to including BIKTARVY and DOVATO, opted for the former.   Lab Results  Component Value Date   HIV1RNAQUANT 26 (H) 01/26/2018   HIV1RNAQUANT <20 NOT DETECTED 01/29/2017   HIV1RNAQUANT <20 05/26/2016      Lab Results  Component Value Date   CD4TABS 1,070 01/26/2018   CD4TABS 1,010 01/29/2017   CD4TABS 1,340 05/26/2016       Past Medical History:  Diagnosis Date  . 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 HIV infection (HCC)   . Hypertension   . Transaminitis     No past surgical history on file.  No family history on file.    Social History   Socioeconomic History  . Marital status: Single    Spouse name: Not on file  . Number of children: Not on file  . Years of education: Not on file  . Highest education level: Not on file  Occupational History  . Not on file  Social Needs  . Financial resource strain: Not on file  . Food insecurity:    Worry: Not on file    Inability: Not on file  . Transportation needs:    Medical: Not on file    Non-medical: Not on file  Tobacco Use  . Smoking status: Never Smoker  . Smokeless tobacco: Never Used  Substance and Sexual Activity  . Alcohol use: Yes    Alcohol/week: 5.0 standard drinks    Types: 5 Standard drinks or equivalent per week  . Drug use: Not on file  . Sexual activity: Not on file  Lifestyle  . Physical activity:    Days per week: Not on file    Minutes per session: Not on file  . Stress: Not on file  Relationships  . Social connections:    Talks on phone: Not on file    Gets together: Not on file    Attends religious service: Not on file    Active member of club or organization: Not on file    Attends meetings of  clubs or organizations: Not on file    Relationship status: Not on file  Other Topics Concern  . Not on file  Social History Narrative  . Not on file    Allergies  Allergen Reactions  . Sustiva [Efavirenz] Other (See Comments)    ? hepatitis     Current Outpatient Medications:  .  DESCOVY 200-25 MG tablet, TAKE 1 TABLET BY MOUTH EVERY DAY WITH TIVICAY, Disp: 90 tablet, Rfl: 2 .  lisinopril (PRINIVIL,ZESTRIL) 20 MG tablet, TAKE 1 TABLET (20 MG TOTAL) BY MOUTH DAILY., Disp: 90 tablet, Rfl: 2 .  TIVICAY 50 MG tablet, TAKE ONE TABLET (50 MG) BY MOUTH ONCE DAILY. STORE AT ROOM TEMPERATURE., Disp: 90 tablet, Rfl: 2 .  valACYclovir (VALTREX) 1000 MG tablet, TAKE 1/2 TABLET BY MOUTH DAILY, Disp: 45 tablet, Rfl: 1        Review of Systems  Constitutional: Negative for activity change, appetite change, chills, diaphoresis, fatigue, fever and unexpected weight change.  HENT: Negative for congestion, rhinorrhea, sinus pressure, sneezing, sore throat and trouble swallowing.   Eyes: Negative for photophobia and visual disturbance.  Respiratory: Negative for cough,  chest tightness, shortness of breath, wheezing and stridor.   Cardiovascular: Negative for chest pain, palpitations and leg swelling.  Gastrointestinal: Negative for abdominal distention, abdominal pain, anal bleeding, blood in stool, constipation, diarrhea, nausea and vomiting.  Genitourinary: Negative for difficulty urinating, dysuria, flank pain and hematuria.  Musculoskeletal: Negative for arthralgias, back pain, gait problem, joint swelling and myalgias.  Skin: Negative for color change, pallor, rash and wound.  Neurological: Negative for dizziness, tremors, weakness and light-headedness.  Hematological: Negative for adenopathy. Does not bruise/bleed easily.  Psychiatric/Behavioral: Negative for agitation, behavioral problems, confusion, decreased concentration, dysphoric mood and sleep disturbance.       Objective:    Physical Exam  Constitutional: He is oriented to person, place, and time. He appears well-developed and well-nourished.  HENT:  Head: Normocephalic and atraumatic.  Eyes: Conjunctivae and EOM are normal.  Neck: Normal range of motion. Neck supple.  Cardiovascular: Normal rate and regular rhythm.  Pulmonary/Chest: Effort normal. No respiratory distress. He has no wheezes.  Abdominal: Soft. He exhibits no distension. There is no tenderness. There is no rebound.  Musculoskeletal: Normal range of motion. He exhibits no edema or tenderness.  Neurological: He is alert and oriented to person, place, and time.  Skin: Skin is warm and dry. No rash noted. No erythema. No pallor.  Psychiatric: He has a normal mood and affect. His behavior is normal. Judgment and thought content normal.          Assessment & Plan:   HIV: To BIKTARVY and recheck labs in 3 months time  Autoimmune vs drug induced hepatitis: Resolved Hyperglycmia: check A1c  HTN: continue  lisinopril to 40mg . He is also supposed to be followed by Dr Nehemiah Settle for PCP  I spent greater than 25 minutes with the patient including greater than 50% of time in face to face counsel of the patient a different single tablet regimen options that we have for him and in coordination of his care.

## 2018-04-02 NOTE — Progress Notes (Addendum)
HPI: [name] [name] is a 47 y.o. male who presents to [name] to follow-up with Dr. Daiva EvesVan Dam for his HIV infection.  Patient Active Problem List   Diagnosis Date Noted  . Hypertension 02/09/2017  . 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 Autoimmune hepatitis (HCC) 05/24/2014  . Transaminitis 03/01/2014  . Pain of left leg 05/23/2013  . Anemia, normocytic normochromic 11/26/2011  . Blood per rectum 11/26/2011  . Erectile dysfunction 06/19/2011  . Benign essential HTN 07/11/2010  . PLANTAR FASCIITIS, RIGHT 11/26/2009  . Hepatitis 11/29/2008  . SKIN RASH, ALLERGIC 12/14/2007  . Human immunodeficiency virus (HIV) disease (HCC) 10/08/2006  . INFECTION, CHLAMYDIAL NOS 10/08/2006    Patient's Medications  New Prescriptions   BIKTARVY 50-200-25 MG TABS TABLET    Take 1 tablet by mouth daily.  Previous Medications   LISINOPRIL (PRINIVIL,ZESTRIL) 20 MG TABLET    TAKE 1 TABLET (20 MG TOTAL) BY MOUTH DAILY.   VALACYCLOVIR (VALTREX) 1000 MG TABLET    TAKE 1/2 TABLET BY MOUTH DAILY  Modified Medications   No medications on file  Discontinued Medications   DESCOVY 200-25 MG TABLET    TAKE 1 TABLET BY MOUTH EVERY DAY WITH TIVICAY   TIVICAY 50 MG TABLET    TAKE ONE TABLET (50 MG) BY MOUTH ONCE DAILY. STORE AT ROOM TEMPERATURE.    Allergies: Allergies  Allergen Reactions  . Sustiva [Efavirenz] Other (See Comments)    ? hepatitis    Past Medical History: Past Medical History:  Diagnosis Date  . 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 HIV infection (HCC)   . Hypertension   . Transaminitis     Social History: Social History   Socioeconomic History  . Marital status: Single    Spouse name: Not on file  . Number of children: Not on file  . Years of education: Not on file  . Highest education level: Not on file  Occupational History  . Not on file  Social Needs  . Financial resource strain: Not on file  . Food insecurity:    Worry: Not on file    Inability: Not on file    . Transportation needs:    Medical: Not on file    Non-medical: Not on file  Tobacco Use  . Smoking status: Never Smoker  . Smokeless tobacco: Never Used  Substance and Sexual Activity  . Alcohol use: Yes    Alcohol/week: 5.0 standard drinks    Types: 5 Standard drinks or equivalent per week  . Drug use: Not on file  . Sexual activity: Not on file  Lifestyle  . Physical activity:    Days per week: Not on file    Minutes per session: Not on file  . Stress: Not on file  Relationships  . Social connections:    Talks on phone: Not on file    Gets together: Not on file    Attends religious service: Not on file    Active member of club or organization: Not on file    Attends meetings of clubs or organizations: Not on file    Relationship status: Not on file  Other Topics Concern  . Not on file  Social History Narrative  . Not on file    Labs: Lab Results  Component Value Date   HIV1RNAQUANT 26 (H) 01/26/2018   HIV1RNAQUANT <20 NOT DETECTED 01/29/2017   HIV1RNAQUANT <20 05/26/2016   CD4TABS 1,070 01/26/2018   CD4TABS 1,010 01/29/2017   CD4TABS 1,340 05/26/2016  RPR and STI Lab Results  Component Value Date   LABRPR NON-REACTIVE 01/26/2018   LABRPR NON-REACTIVE 01/29/2017   LABRPR NON REAC 05/26/2016   LABRPR NON REAC 07/26/2015   LABRPR NON REAC 04/10/2015   RPRTITER 1:2 10/10/2010   RPRTITER 1:2 09/25/2010    STI Results GC CT  01/26/2018 Negative Negative  07/26/2015 Negative Negative  04/10/2015 Negative Negative  08/15/2014 Negative Negative  05/24/2014 NG: Negative CT: Negative    Hepatitis B Lab Results  Component Value Date   HEPBSAB NEG 10/08/2006   HEPBSAG NEGATIVE 05/24/2014   HEPBCAB NEG 10/08/2006   Hepatitis C No results found for: HEPCAB, HCVRNAPCRQN Hepatitis A No results found for: HAV Lipids: Lab Results  Component Value Date   CHOL 152 01/26/2018   TRIG 116 01/26/2018   HDL 47 01/26/2018   CHOLHDL 3.2 01/26/2018   VLDL 27  05/26/2016   LDLCALC 84 01/26/2018    Current HIV Regimen: Tivicay + Descovy  Assessment: [name] [name] is here today to see Dr. Daiva Eves for HIV follow-up.  He is doing well on Tivicay and Descovy with a HIV viral load of 26. [facility] is [facility] issues with [facility] and would like to switch to our pharmacy.  I explained [name] and our services. He would like it mailed monthly. Dr. Daiva Eves will also change him to [name] [name] today.  Explained that [name] [name] is a one pill once daily medication with or without food and [name] importance of not missing any doses.  Explained resistance and how it develops and why it is so important to take Biktarvy daily and not skip days or doses. Counseled patient to take it around [name] same time each day. Counseled on what to do if dose is missed, if closer to missed dose take immediately, if closer to next dose then skip and resume normal schedule. Cautioned on possible side effects [name] first week or so including nausea, diarrhea, dizziness, and headaches but that they should resolve after [name] first couple of weeks. I reviewed patient medications and found no drug interactions. Counseled patient to separate Biktarvy from divalent cations including multivitamins. Discussed with patient to call clinic if he starts a new medication or herbal supplement.   Plan: - Stop Tivicay and Descovy - Start Biktarvy PO once daily - Mail from [name]  [name] [name] L. [name] [name], PharmD, AAHIVP, CPP Infectious Diseases Clinical Pharmacist [facility] 04/02/2018, 1:48 PM

## 2018-05-28 ENCOUNTER — Other Ambulatory Visit: Payer: Self-pay | Admitting: Infectious Disease

## 2018-05-28 DIAGNOSIS — B029 Zoster without complications: Secondary | ICD-10-CM

## 2018-06-22 ENCOUNTER — Other Ambulatory Visit: Payer: Self-pay | Admitting: Pharmacist

## 2018-06-22 ENCOUNTER — Other Ambulatory Visit: Payer: Self-pay | Admitting: Internal Medicine

## 2018-06-22 DIAGNOSIS — N63 Unspecified lump in unspecified breast: Secondary | ICD-10-CM

## 2018-06-22 DIAGNOSIS — B20 Human immunodeficiency virus [HIV] disease: Secondary | ICD-10-CM

## 2018-06-22 MED ORDER — BIKTARVY 50-200-25 MG PO TABS: 1.0000 | ORAL_TABLET | Freq: Every day | ORAL | 3 refills | Status: DC

## 2018-06-22 NOTE — Progress Notes (Signed)
Patient's insurance requires him to fill at [facility]. Transferred Rx there and Kathie Rhodes will call patient.

## 2018-06-23 ENCOUNTER — Telehealth: Payer: Self-pay | Admitting: Pharmacy Technician

## 2018-06-23 NOTE — Telephone Encounter (Signed)
Mr. Stadtlander insurance is requiring that his Biktarvy medication be filled through their specialty pharmacy [facility] [phone].  The prescription was transferred there.  He has the phone number and stated he will call them to set up mailing to his home.

## 2018-06-25 ENCOUNTER — Ambulatory Visit
Admission: RE | Admit: 2018-06-25 | Discharge: 2018-06-25 | Disposition: A | Discharge status: Home / Self Care | Payer: BC Managed Care – PPO | Source: Ambulatory Visit | Attending: Internal Medicine | Admitting: Internal Medicine

## 2018-06-25 ENCOUNTER — Ambulatory Visit: Payer: BC Managed Care – PPO

## 2018-06-25 DIAGNOSIS — N63 Unspecified lump in unspecified breast: Secondary | ICD-10-CM

## 2018-07-07 NOTE — Progress Notes (Signed)
Name: [name] [name]  DOB: Oct 12, 1970 MRN: [id number] PCP: Renford Dills, MD   Patient Active Problem List   Diagnosis Date Noted  . Hypertension 02/09/2017  . 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 Autoimmune hepatitis (HCC) 05/24/2014  . Transaminitis 03/01/2014  . Pain of left leg 05/23/2013  . Anemia, normocytic normochromic 11/26/2011  . Blood per rectum 11/26/2011  . Erectile dysfunction 06/19/2011  . Benign essential HTN 07/11/2010  . PLANTAR FASCIITIS, RIGHT 11/26/2009  . Hepatitis 11/29/2008  . SKIN RASH, ALLERGIC 12/14/2007  . Human immunodeficiency virus (HIV) disease (HCC) 10/08/2006  . INFECTION, CHLAMYDIAL NOS 10/08/2006     Subjective:  CC: HIV follow up care. No complaints/concerns today.   HPI: [name] [name] is a 48 y.o. male with HIV disease, Dx 2012 is here today for follow up for his HIV care. He is currently taking Biktarvy and reports 100% adherence to regimen. Labs last done in Sept 2019 shows an undetectable viral load and healthy CD4 > 1000. Last office visit was in November 2019 and was doing well at that time. He has had at least one new male partner with oral/penile sites exposed.   No changes or additions to his medical/surgical history. No interval illnesses or hospitalizations/ER visits.   He is taking lisinopril for his blood pressure and managed by Dr. Nehemiah Settle. He received Prevnar vaccine and flu shot recently. He has a few questions about supplements he would like to start taking.   Review of Systems  Constitutional: Negative for chills, fever, malaise/fatigue and weight loss.  HENT: Negative for sore throat.        No dental problems  Respiratory: Negative for cough and sputum production.   Cardiovascular: Negative for chest pain and leg swelling.  Gastrointestinal: Negative for abdominal pain, diarrhea and vomiting.  Genitourinary: Negative for dysuria and flank pain.  Musculoskeletal: Negative for joint pain, myalgias and neck pain.    Skin: Negative for rash.  Neurological: Negative for dizziness, tingling and headaches.  Psychiatric/Behavioral: Negative for depression and substance abuse. The patient is not nervous/anxious and does not have insomnia.     Past Medical History:  Diagnosis Date  . 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 HIV infection (HCC)   . Hypertension   . Transaminitis     Outpatient Medications Prior to Visit  Medication Sig Dispense Refill  . BIKTARVY 50-200-25 MG TABS tablet Take 1 tablet by mouth daily. 90 tablet 3  . lisinopril (PRINIVIL,ZESTRIL) 20 MG tablet TAKE 1 TABLET (20 MG TOTAL) BY MOUTH DAILY. 90 tablet 2  . valACYclovir (VALTREX) 1000 MG tablet TAKE 1/2 TABLET BY MOUTH DAILY 45 tablet 0   No facility-administered medications prior to visit.      Allergies  Allergen Reactions  . Sustiva [Efavirenz] Other (See Comments)    ? hepatitis    Social History   Tobacco Use  . Smoking status: Never Smoker  . Smokeless tobacco: Never Used  Substance Use Topics  . Alcohol use: Yes    Alcohol/week: 5.0 standard drinks    Types: 5 Standard drinks or equivalent per week  . Drug use: Not on file    Social History   Substance and Sexual Activity  Sexual Activity Not on file     Objective:   Vitals:   07/08/18 0905  BP: 120/82  Pulse: 64  Temp: 98.1 F (36.7 C)  Weight: 212 lb (96.2 kg)   Body mass index is 32.23 kg/m.  Physical Exam Vitals signs reviewed.  Constitutional:  Appearance: He is well-developed.     Comments: Seated comfortably in chair during visit.   HENT:     Mouth/Throat:     Dentition: Normal dentition. No dental abscesses.  Cardiovascular:     Rate and Rhythm: Normal rate and regular rhythm.     Heart sounds: Normal heart sounds.  Pulmonary:     Effort: Pulmonary effort is normal.     Breath sounds: Normal breath sounds.  Abdominal:     General: There is no distension.     Palpations: Abdomen is soft.     Tenderness: There is no  abdominal tenderness.  Lymphadenopathy:     Cervical: No cervical adenopathy.  Skin:    General: Skin is warm and dry.     Findings: No rash.  Neurological:     Mental Status: He is alert and oriented to person, place, and time.  Psychiatric:        Judgment: Judgment normal.     Comments: In good spirits today and engaged in care discussion.      Lab Results Lab Results  Component Value Date   WBC 6.7 01/26/2018   HGB 11.7 (L) 01/26/2018   HCT 37.2 (L) 01/26/2018   MCV 69.9 (L) 01/26/2018   PLT 197 01/26/2018    Lab Results  Component Value Date   CREATININE 1.30 01/26/2018   BUN 16 01/26/2018   NA 137 01/26/2018   K 4.1 01/26/2018   CL 105 01/26/2018   CO2 22 01/26/2018    Lab Results  Component Value Date   ALT 10 01/26/2018   AST 12 01/26/2018   ALKPHOS 74 05/26/2016   BILITOT 0.3 01/26/2018    Lab Results  Component Value Date   CHOL 152 01/26/2018   HDL 47 01/26/2018   LDLCALC 84 01/26/2018   TRIG 116 01/26/2018   CHOLHDL 3.2 01/26/2018   HIV 1 RNA Quant (copies/mL)  Date Value  01/26/2018 26 (H)  01/29/2017 <20 NOT DETECTED  05/26/2016 <20   CD4 T Cell Abs (/uL)  Date Value  01/26/2018 1,070  01/29/2017 1,010  05/26/2016 1,340     Assessment & Plan:   Problem List Items Addressed This Visit      Unprioritized   Human immunodeficiency virus (HIV) disease (HCC) - Primary    Reviewed labs with him today. His biktarvy is working perfectly for him.  Counseled on separation of all vitamins by 6h.  I asked him to please forward a photo of the supplement so we can check for interactions.  Return in about 6 months (around 01/06/2019).       Relevant Orders   HIV-1 RNA quant-no reflex-bld    Other Visit Diagnoses    Routine screening for STI (sexually transmitted infection)       Relevant Orders   Cytology (oral, anal, urethral) ancillary only   RPR   Urine cytology ancillary only     Rexene Alberts, MSN, NP-C [facility] Pager: [phone] Office: [phone]  07/08/18  2:44 PM

## 2018-07-08 ENCOUNTER — Other Ambulatory Visit (HOSPITAL_COMMUNITY)
Admission: RE | Admit: 2018-07-08 | Discharge: 2018-07-08 | Disposition: A | Discharge status: Home / Self Care | Payer: BC Managed Care – PPO | Source: Ambulatory Visit | Attending: Infectious Disease | Admitting: Infectious Disease

## 2018-07-08 ENCOUNTER — Ambulatory Visit: Payer: BC Managed Care – PPO | Admitting: Infectious Diseases

## 2018-07-08 ENCOUNTER — Encounter: Payer: Self-pay | Admitting: Infectious Diseases

## 2018-07-08 VITALS — BP 120/82 | HR 64 | Temp 98.1°F | Wt 212.0 lb

## 2018-07-08 DIAGNOSIS — Z113 Encounter for screening for infections with a predominantly sexual mode of transmission: Secondary | ICD-10-CM | POA: Diagnosis not present

## 2018-07-08 DIAGNOSIS — B20 Human immunodeficiency virus [HIV] disease: Secondary | ICD-10-CM | POA: Diagnosis present

## 2018-07-08 NOTE — Assessment & Plan Note (Signed)
Reviewed labs with him today. His biktarvy is working perfectly for him.  Counseled on separation of all vitamins by 6h.  I asked him to please forward a photo of the supplement so we can check for interactions.  Return in about 6 months (around 01/06/2019).

## 2018-07-08 NOTE — Patient Instructions (Addendum)
Nice to met you today!   Please upload a photo of the two supplements you are considering so we can review.   Please continue your biktarvy once a day as you are currently taking. If you want to start/continue a multivitamin please make sure that you take it at least 4-6 hours after your biktarvy.   Will release your mychart results once I see them.   Please return in 6 months to see Dr. Van Dam.  

## 2018-07-09 ENCOUNTER — Ambulatory Visit: Payer: BC Managed Care – PPO | Admitting: Infectious Diseases

## 2018-07-09 LAB — URINE CYTOLOGY ANCILLARY ONLY
Chlamydia: NEGATIVE
Neisseria Gonorrhea: NEGATIVE

## 2018-07-09 LAB — CYTOLOGY, (ORAL, ANAL, URETHRAL) ANCILLARY ONLY
CHLAMYDIA, DNA PROBE: NEGATIVE
NEISSERIA GONORRHEA: NEGATIVE

## 2018-07-12 LAB — RPR: RPR: NONREACTIVE

## 2018-07-12 LAB — HIV-1 RNA QUANT-NO REFLEX-BLD
HIV 1 RNA QUANT: 29 {copies}/mL — AB
HIV-1 RNA QUANT, LOG: 1.46 {Log_copies}/mL — AB

## 2018-07-23 ENCOUNTER — Other Ambulatory Visit: Payer: Self-pay | Admitting: Pharmacist

## 2018-07-23 DIAGNOSIS — B20 Human immunodeficiency virus [HIV] disease: Secondary | ICD-10-CM

## 2018-07-23 NOTE — Telephone Encounter (Signed)
I ran all of these through my natural medicine drug interaction checker with Biktarvy and found no drug interactions, luckily!

## 2018-09-01 ENCOUNTER — Other Ambulatory Visit: Payer: Self-pay | Admitting: Infectious Disease

## 2018-09-01 DIAGNOSIS — B029 Zoster without complications: Secondary | ICD-10-CM

## 2018-12-27 ENCOUNTER — Other Ambulatory Visit: Payer: Self-pay | Admitting: *Deleted

## 2018-12-27 DIAGNOSIS — B20 Human immunodeficiency virus [HIV] disease: Secondary | ICD-10-CM

## 2019-01-06 ENCOUNTER — Other Ambulatory Visit (HOSPITAL_COMMUNITY)
Admission: RE | Admit: 2019-01-06 | Discharge: 2019-01-06 | Disposition: A | Discharge status: Home / Self Care | Payer: BC Managed Care – PPO | Source: Ambulatory Visit | Attending: Infectious Disease | Admitting: Infectious Disease

## 2019-01-06 ENCOUNTER — Other Ambulatory Visit: Payer: Self-pay

## 2019-01-06 ENCOUNTER — Other Ambulatory Visit: Payer: BC Managed Care – PPO

## 2019-01-06 DIAGNOSIS — B20 Human immunodeficiency virus [HIV] disease: Secondary | ICD-10-CM | POA: Insufficient documentation

## 2019-01-07 LAB — T-HELPER CELL (CD4) - (RCID CLINIC ONLY)
CD4 % Helper T Cell: 49 % (ref 33–65)
CD4 T Cell Abs: 1156 /uL (ref 400–1790)

## 2019-01-08 LAB — URINE CYTOLOGY ANCILLARY ONLY
Chlamydia: NEGATIVE
Neisseria Gonorrhea: NEGATIVE

## 2019-01-11 LAB — HIV-1 RNA QUANT-NO REFLEX-BLD
HIV 1 RNA Quant: <20 copies/mL
HIV-1 RNA Quant, Log: <1.3 Log copies/mL

## 2019-01-20 ENCOUNTER — Other Ambulatory Visit: Payer: Self-pay

## 2019-01-20 ENCOUNTER — Encounter: Payer: Self-pay | Admitting: Infectious Disease

## 2019-01-20 ENCOUNTER — Ambulatory Visit (INDEPENDENT_AMBULATORY_CARE_PROVIDER_SITE_OTHER): Payer: BC Managed Care – PPO

## 2019-01-20 ENCOUNTER — Ambulatory Visit (INDEPENDENT_AMBULATORY_CARE_PROVIDER_SITE_OTHER): Payer: BC Managed Care – PPO | Admitting: Infectious Disease

## 2019-01-20 DIAGNOSIS — Z23 Encounter for immunization: Secondary | ICD-10-CM

## 2019-01-20 DIAGNOSIS — N62 Hypertrophy of breast: Secondary | ICD-10-CM | POA: Diagnosis not present

## 2019-01-20 DIAGNOSIS — B20 Human immunodeficiency virus [HIV] disease: Secondary | ICD-10-CM

## 2019-01-20 DIAGNOSIS — I10 Essential (primary) hypertension: Secondary | ICD-10-CM

## 2019-01-20 DIAGNOSIS — R7989 Other specified abnormal findings of blood chemistry: Secondary | ICD-10-CM

## 2019-01-20 DIAGNOSIS — D649 Anemia, unspecified: Secondary | ICD-10-CM

## 2019-01-20 HISTORY — DX: Other specified abnormal findings of blood chemistry: R79.89

## 2019-01-20 HISTORY — DX: Anemia, unspecified: D64.9

## 2019-01-20 HISTORY — DX: Hypertrophy of breast: N62

## 2019-01-20 MED ORDER — BIKTARVY 50-200-25 MG PO TABS: 1.0000 | ORAL_TABLET | Freq: Every day | ORAL | 3 refills | Status: DC

## 2019-01-20 NOTE — Progress Notes (Signed)
Virtual Visit via Telephone Note  I connected with [name] [name] on 01/20/19 at  9:30 AM EDT by telephone and verified that I am speaking with the correct person using two identifiers.  Location: Patient: Home Provider: Home   I discussed the limitations, risks, security and privacy concerns of performing an evaluation and management service by telephone and the availability of in person appointments. I also discussed with the patient that there may be a patient responsible charge related to this service. The patient expressed understanding and agreed to proceed.  Chief complaint: area of gynecomastia in left breast  History of Present Illness:  [name] [name] is an African-American man living with HIV that is well controlled currently on Biktarvy.  He did have an area in his left breast that developed a lump and had a mammogram performed this February.  The radiologist speculated whether it could be related to low testosterone and indeed he states that Dr. Delfina Redwood has found that he does have low testosterone.  He is considering testosterone replacement.  I counseled make sure that he would also have a PSA checked.  Also was having anemia worked up which he has had for quite some time.  We did not find a clear-cut cause of his anemia though in the past he did have a history of rectal bleeding.  He has not had a colonoscopy so that something I would strongly consider.  He has been working on losing weight and is dropped 10 pounds through diet and also exercise.  He is taking his medications faithfully for blood pressure as well.  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  Himself wears a mask and thoroughly cleans all the areas that he works in and has social distance at work.    Observations/Objective:  [name] [name] appears to do quite well and is taking his medications religiously.  Assessment and  Plan:  HIV disease: I have sent him a years worth of Biktarvy and prescriptions [facility]  Hypertension continue the ACE inhibitor  Overweight continue to try to restrict carbohydrates.  Gynecomastia: Consider testosterone replacement defer to Dr. Caryl Comes was checked these labs.  Normocytic anemia; would recommend colonoscopy  Follow Up Instructions:    I discussed the assessment and treatment plan with the patient. The patient was provided an opportunity to ask questions and all were answered. The patient agreed with the plan and demonstrated an understanding of the instructions.   The patient was advised to call back or seek an in-person evaluation if the symptoms worsen or if the condition fails to improve as anticipated.  I provided 22 minutes of non-face-to-face time during this encounter.   Alcide Evener, MD

## 2019-06-30 ENCOUNTER — Ambulatory Visit: Payer: BC Managed Care – PPO

## 2019-07-14 ENCOUNTER — Other Ambulatory Visit: Payer: Self-pay

## 2019-07-14 ENCOUNTER — Ambulatory Visit: Payer: BC Managed Care – PPO | Attending: Family

## 2019-07-14 DIAGNOSIS — Z23 Encounter for immunization: Secondary | ICD-10-CM

## 2019-07-14 NOTE — Progress Notes (Signed)
   Covid-19 Vaccination Clinic  Name:  [name] [name]    MRN: [id number] DOB: 1970-07-10  07/14/2019  Mr. [name] [name] was observed post Covid-19 immunization for 15 minutes without incidence. He was provided with Vaccine Information Sheet and instruction to access the V-Safe system.   Mr. [name] [name] was instructed to call 911 with any severe reactions post vaccine: Marland Kitchen Difficulty breathing  . Swelling of your face and throat  . A fast heartbeat  . A bad rash all over your body  . Dizziness and weakness    Immunizations Administered    Name Date Dose VIS Date Route   Moderna COVID-19 Vaccine 07/14/2019  2:32 PM 0.5 mL 04/19/2019 Intramuscular   Manufacturer: Moderna   Lot: [id number]   NDC: 14782-956-21

## 2019-08-02 IMAGING — MG DIGITAL DIAGNOSTIC BILATERAL MAMMOGRAM WITH TOMO AND CAD
6 of 10 series · 6 of 30 positions shown · non-contrast
Comparison: Previous exam(s).

ACR Breast Density Category a: The breast tissue is almost entirely
fatty.

CLINICAL DATA: 47-year-old male with new left breast pain and
palpable lump.

EXAM:
DIGITAL DIAGNOSTIC BILATERAL MAMMOGRAM WITH CAD AND TOMO

[L CC synth-2D (1 of 2)]
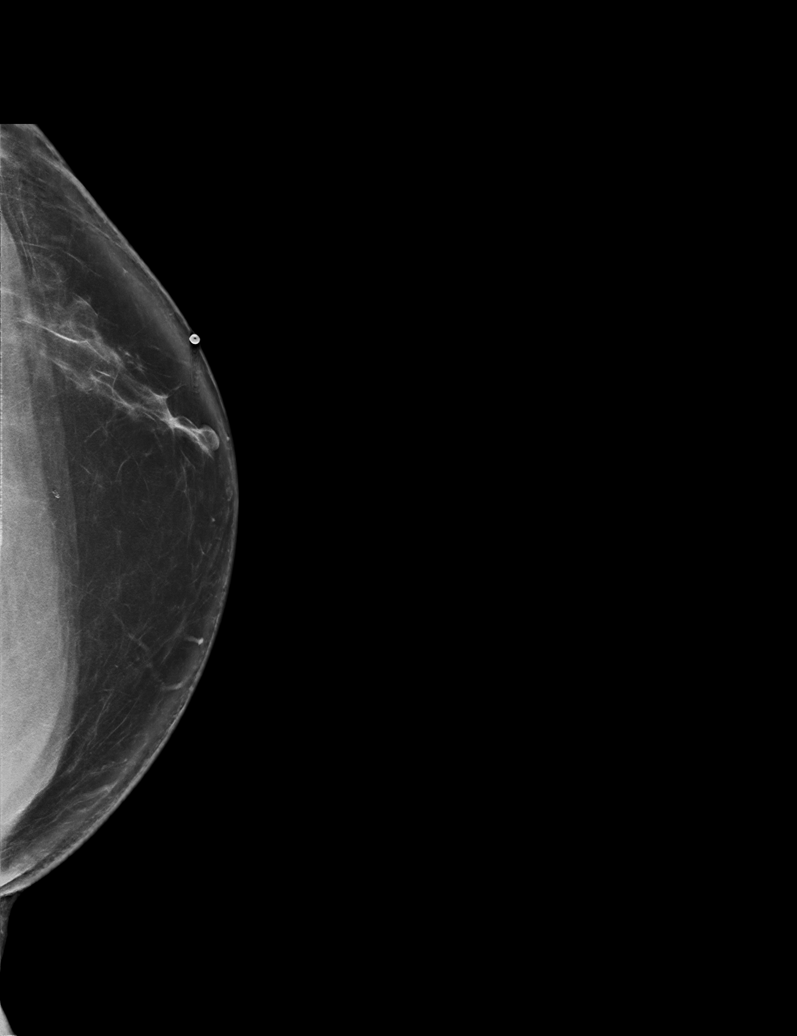

[L CC synth-2D (2 of 2)]
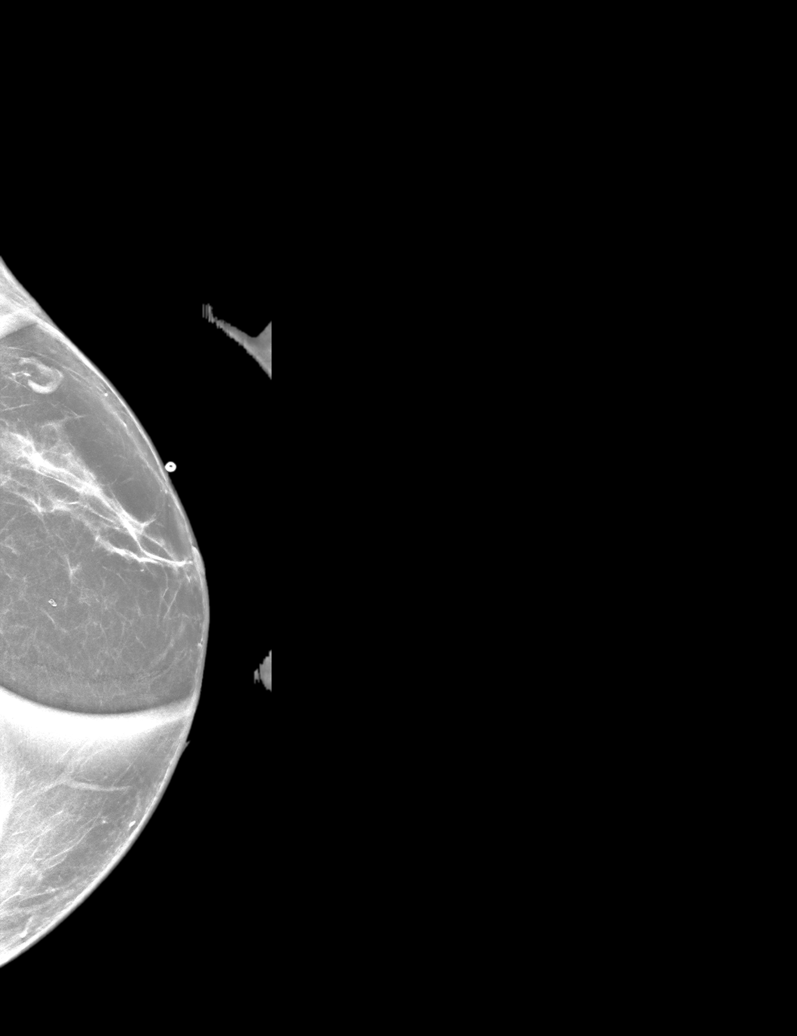

[R MLO synth-2D]
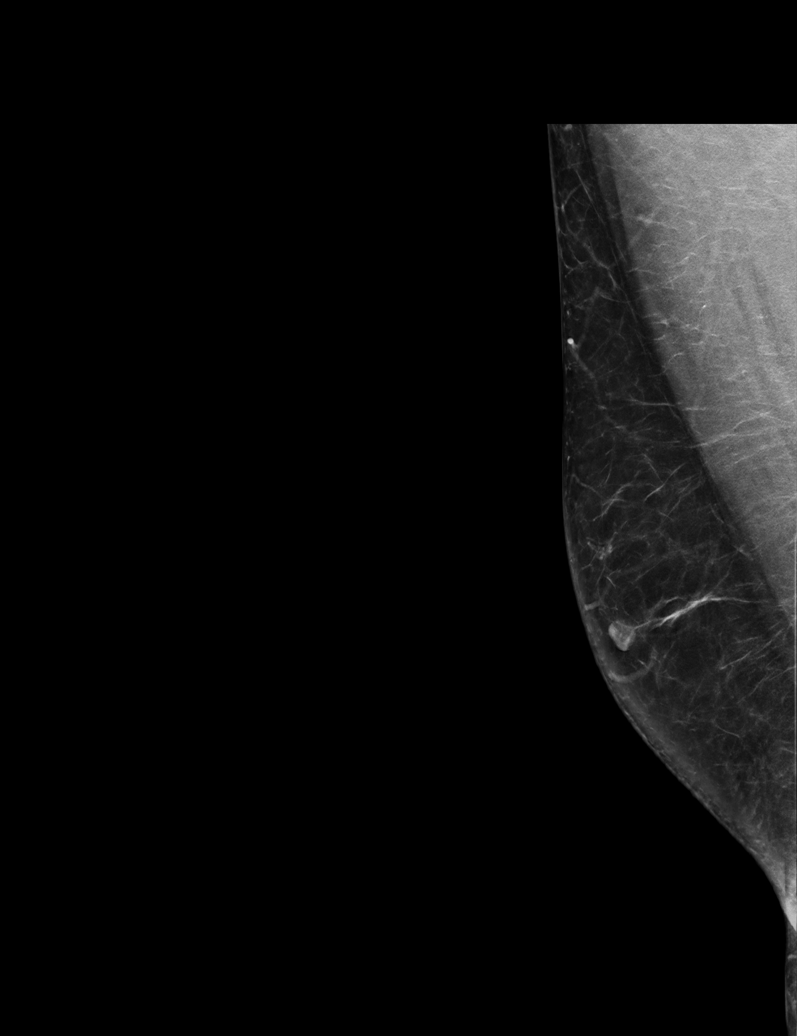

[R CC synth-2D]
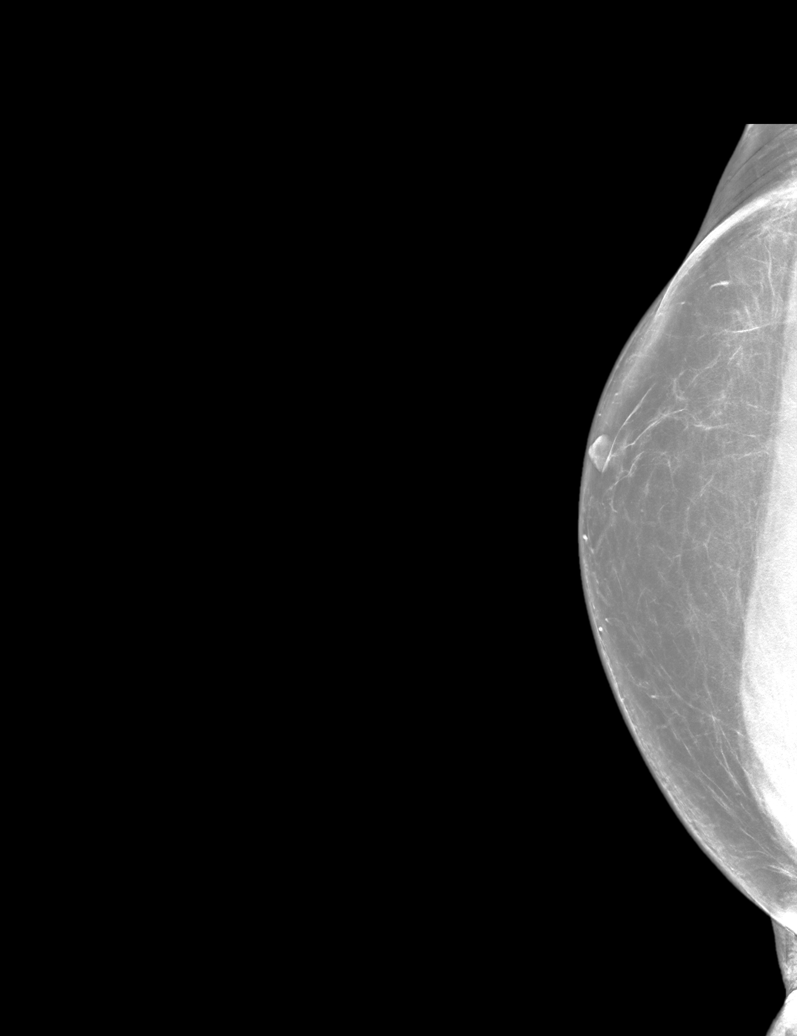

[L MLO synth-2D]
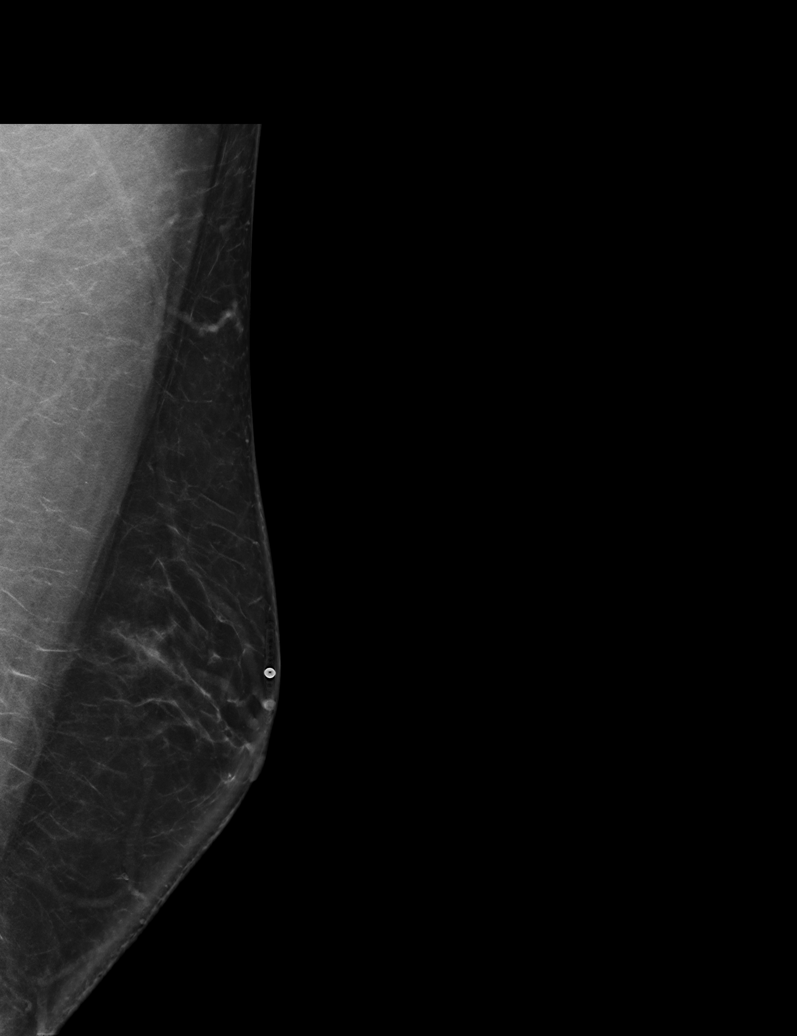

[R MLO tomo · tomo slice 37/74.0]
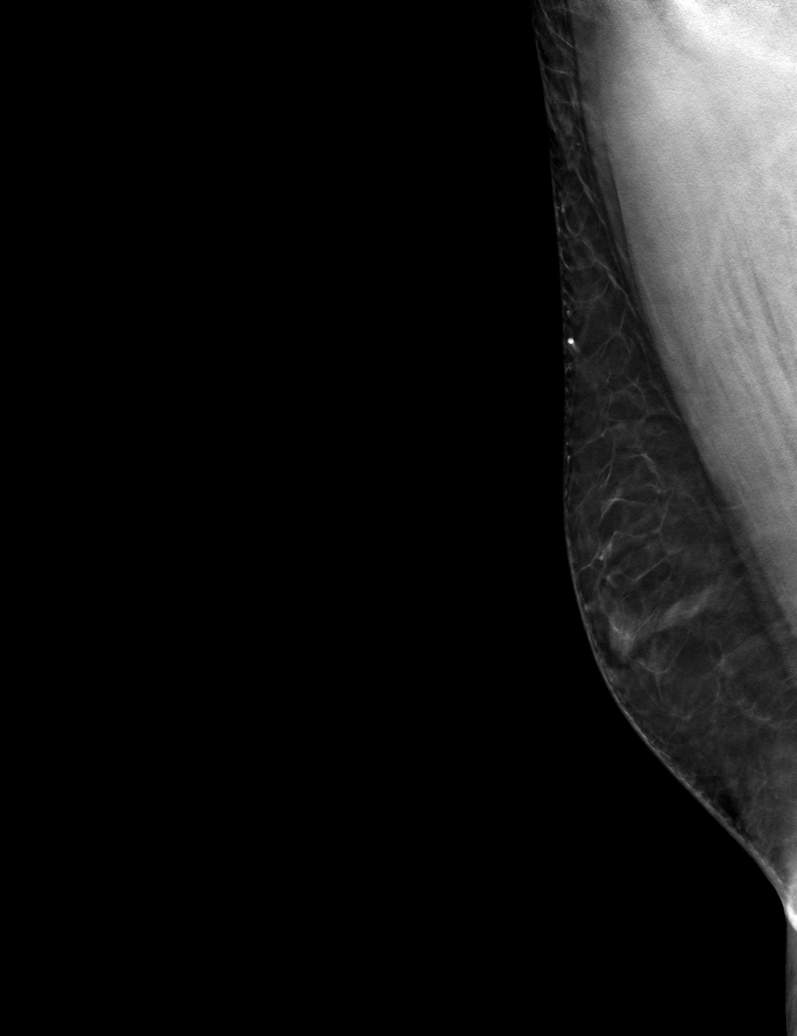

[6 of 30 positions shown; findings below may reference images not displayed]

FINDINGS: Fibroglandular tissue with interspersed fat is seen arising from
left nipple and extending posteriorly. This is consistent with mild
to moderate left-sided gynecomastia, corresponding with the
patient's clinical symptoms. A minimal amount of gynecomastia is
identified on the right. No suspicious mammographic findings are
identified.

Mammographic images were processed with CAD.
IMPRESSION: Findings consistent with mild to moderate left-sided and minimal
right-sided gynecomastia, likely accounting for the patient's
clinical symptoms.

RECOMMENDATION:
I discussed with the patient the fact that gynecomastia can occur in
older men as testosterone levels decrease with age or in younger men
with low testosterone levels, causing a change in the serum
testosterone:estrogen ratio. We also discussed other potential
etiologies of gynecomastia including numerous prescription
medications and recreational drugs (marijuana and anabolic steroids
in particular). I counseled the patient to perform self-examination
to make sure that a hard lump does not form which could indicate
malignancy and would need further evaluation. We also discussed the
possibility of surgical excision if symptoms continue and if an
etiology of the gynecomastia cannot be determined and therefore
corrected.

I have discussed the findings and recommendations with the patient.
Results were also provided in writing at the conclusion of the
visit. If applicable, a reminder letter will be sent to the patient
regarding the next appointment.

BI-RADS CATEGORY  2: Benign.

## 2019-08-16 ENCOUNTER — Ambulatory Visit: Payer: BC Managed Care – PPO | Attending: Family

## 2019-08-16 DIAGNOSIS — Z23 Encounter for immunization: Secondary | ICD-10-CM

## 2019-08-16 NOTE — Progress Notes (Signed)
   Covid-19 Vaccination Clinic  Name:  Omarii Scalzo    MRN: [id number] DOB: 02/24/1971  08/16/2019  Mr. Kittleson was observed post Covid-19 immunization for 15 minutes without incident. He was provided with Vaccine Information Sheet and instruction to access the V-Safe system.   Mr. Desroches was instructed to call 911 with any severe reactions post vaccine: Marland Kitchen Difficulty breathing  . Swelling of face and throat  . A fast heartbeat  . A bad rash all over body  . Dizziness and weakness   Immunizations Administered    Name Date Dose VIS Date Route   Moderna COVID-19 Vaccine 08/16/2019  9:10 AM 0.5 mL 04/19/2019 Intramuscular   Manufacturer: Moderna   Lot: [id number]   NDC: 38182-993-71

## 2019-09-15 ENCOUNTER — Other Ambulatory Visit: Payer: Self-pay | Admitting: Infectious Disease

## 2019-09-15 DIAGNOSIS — B029 Zoster without complications: Secondary | ICD-10-CM

## 2019-12-27 ENCOUNTER — Other Ambulatory Visit: Payer: Self-pay | Admitting: Infectious Disease

## 2019-12-27 DIAGNOSIS — B20 Human immunodeficiency virus [HIV] disease: Secondary | ICD-10-CM

## 2020-01-17 ENCOUNTER — Ambulatory Visit: Payer: BC Managed Care – PPO | Attending: Internal Medicine

## 2020-01-17 DIAGNOSIS — Z23 Encounter for immunization: Secondary | ICD-10-CM

## 2020-01-17 NOTE — Progress Notes (Signed)
   Covid-19 Vaccination Clinic  Name:  [name] [name]    MRN: [id number] DOB: 03/01/1971  01/17/2020  [name] [name] was observed post Covid-19 immunization for 15 minutes without incident. He was provided with Vaccine Information Sheet and instruction to access the V-Safe system.   [name] [name] was instructed to call 911 with any severe reactions post vaccine: Marland Kitchen Difficulty breathing  . Swelling of face and throat  . A fast heartbeat  . A bad rash all over body  . Dizziness and weakness   Immunizations Administered    Name Date Dose VIS Date Route   Moderna COVID-19 Vaccine 01/17/2020  1:31 PM 0.5 mL 04/2019 Intramuscular   Manufacturer: Moderna   Lot: [id number]   NDC: 38329-191-66

## 2020-01-19 ENCOUNTER — Other Ambulatory Visit: Payer: Self-pay

## 2020-01-19 DIAGNOSIS — B20 Human immunodeficiency virus [HIV] disease: Secondary | ICD-10-CM

## 2020-01-19 DIAGNOSIS — Z113 Encounter for screening for infections with a predominantly sexual mode of transmission: Secondary | ICD-10-CM

## 2020-01-19 DIAGNOSIS — Z79899 Other long term (current) drug therapy: Secondary | ICD-10-CM

## 2020-01-24 ENCOUNTER — Other Ambulatory Visit: Payer: BC Managed Care – PPO

## 2020-01-24 ENCOUNTER — Other Ambulatory Visit: Payer: Self-pay

## 2020-01-24 ENCOUNTER — Other Ambulatory Visit (HOSPITAL_COMMUNITY)
Admission: RE | Admit: 2020-01-24 | Discharge: 2020-01-24 | Disposition: A | Discharge status: Home / Self Care | Payer: BC Managed Care – PPO | Source: Ambulatory Visit | Attending: Infectious Disease | Admitting: Infectious Disease

## 2020-01-24 DIAGNOSIS — B20 Human immunodeficiency virus [HIV] disease: Secondary | ICD-10-CM

## 2020-01-24 DIAGNOSIS — Z113 Encounter for screening for infections with a predominantly sexual mode of transmission: Secondary | ICD-10-CM

## 2020-01-24 DIAGNOSIS — Z79899 Other long term (current) drug therapy: Secondary | ICD-10-CM

## 2020-01-25 LAB — URINE CYTOLOGY ANCILLARY ONLY
Chlamydia: NEGATIVE
Comment: NEGATIVE
Comment: NORMAL
Neisseria Gonorrhea: NEGATIVE

## 2020-01-25 LAB — T-HELPER CELL (CD4) - (RCID CLINIC ONLY)
CD4 % Helper T Cell: 54 % (ref 33–65)
CD4 T Cell Abs: 933 /uL (ref 400–1790)

## 2020-01-27 LAB — COMPLETE METABOLIC PANEL WITH GFR
AG Ratio: 1.7 (calc) (ref 1.0–2.5)
ALT: 10 U/L (ref 9–46)
AST: 13 U/L (ref 10–35)
Albumin: 4.2 g/dL (ref 3.6–5.1)
Alkaline phosphatase (APISO): 75 U/L (ref 35–144)
BUN: 11 mg/dL (ref 7–25)
CO2: 27 mmol/L (ref 20–32)
Calcium: 9.2 mg/dL (ref 8.6–10.3)
Chloride: 105 mmol/L (ref 98–110)
Creat: 1.07 mg/dL (ref 0.70–1.33)
GFR, Est African American: 93 mL/min/{1.73_m2} (ref >=60)
GFR, Est Non African American: 81 mL/min/{1.73_m2} (ref >=60)
Globulin: 2.5 g/dL (calc) (ref 1.9–3.7)
Glucose, Bld: 109 mg/dL — ABNORMAL HIGH (ref 65–99)
Potassium: 4.2 mmol/L (ref 3.5–5.3)
Sodium: 139 mmol/L (ref 135–146)
Total Bilirubin: 0.3 mg/dL (ref 0.2–1.2)
Total Protein: 6.7 g/dL (ref 6.1–8.1)

## 2020-01-27 LAB — LIPID PANEL
Cholesterol: 149 mg/dL (ref <200)
HDL: 43 mg/dL (ref >=40)
LDL Cholesterol (Calc): 88 mg/dL (calc)
Non-HDL Cholesterol (Calc): 106 mg/dL (calc) (ref <130)
Total CHOL/HDL Ratio: 3.5 (calc) (ref <5.0)
Triglycerides: 90 mg/dL (ref <150)

## 2020-01-27 LAB — CBC WITH DIFFERENTIAL/PLATELET
Absolute Monocytes: 391 cells/uL (ref 200–950)
Basophils Absolute: 37 cells/uL (ref 0–200)
Basophils Relative: 0.6 %
Eosinophils Absolute: 68 cells/uL (ref 15–500)
Eosinophils Relative: 1.1 %
HCT: 35.7 % — ABNORMAL LOW (ref 38.5–50.0)
Hemoglobin: 11.2 g/dL — ABNORMAL LOW (ref 13.2–17.1)
Lymphs Abs: 1934 cells/uL (ref 850–3900)
MCH: 22.4 pg — ABNORMAL LOW (ref 27.0–33.0)
MCHC: 31.4 g/dL — ABNORMAL LOW (ref 32.0–36.0)
MCV: 71.4 fL — ABNORMAL LOW (ref 80.0–100.0)
MPV: 10.4 fL (ref 7.5–12.5)
Monocytes Relative: 6.3 %
Neutro Abs: 3770 cells/uL (ref 1500–7800)
Neutrophils Relative %: 60.8 %
Platelets: 214 10*3/uL (ref 140–400)
RBC: 5 10*6/uL (ref 4.20–5.80)
RDW: 15.7 % — ABNORMAL HIGH (ref 11.0–15.0)
Total Lymphocyte: 31.2 %
WBC: 6.2 10*3/uL (ref 3.8–10.8)

## 2020-01-27 LAB — RPR: RPR Ser Ql: NONREACTIVE

## 2020-01-27 LAB — HIV-1 RNA QUANT-NO REFLEX-BLD
HIV 1 RNA Quant: 45 Copies/mL — ABNORMAL HIGH
HIV-1 RNA Quant, Log: 1.65 Log cps/mL — ABNORMAL HIGH

## 2020-02-07 ENCOUNTER — Telehealth: Payer: Self-pay

## 2020-02-07 NOTE — Telephone Encounter (Signed)
COVID-19 Pre-Screening Questions:02/07/20 ° °Do you currently have a fever (>100 °F), chills or unexplained body aches?NO ° °Are you currently experiencing new cough, shortness of breath, sore throat, runny nose? NO °•  °Have you been in contact with someone that is currently pending confirmation of Covid19 testing or has been confirmed to have the Covid19 virus? NO  ° °**If the patient answers NO to ALL questions -  advise the patient to please call the clinic before coming to the office should any symptoms develop.  ° ° °

## 2020-02-08 ENCOUNTER — Telehealth: Payer: Self-pay

## 2020-02-08 ENCOUNTER — Encounter: Payer: Self-pay | Admitting: Infectious Disease

## 2020-02-08 ENCOUNTER — Other Ambulatory Visit: Payer: Self-pay

## 2020-02-08 ENCOUNTER — Telehealth (INDEPENDENT_AMBULATORY_CARE_PROVIDER_SITE_OTHER): Payer: BC Managed Care – PPO | Admitting: Infectious Disease

## 2020-02-08 DIAGNOSIS — B20 Human immunodeficiency virus [HIV] disease: Secondary | ICD-10-CM | POA: Diagnosis not present

## 2020-02-08 DIAGNOSIS — I10 Essential (primary) hypertension: Secondary | ICD-10-CM | POA: Diagnosis not present

## 2020-02-08 DIAGNOSIS — D649 Anemia, unspecified: Secondary | ICD-10-CM

## 2020-02-08 MED ORDER — BIKTARVY 50-200-25 MG PO TABS: 1.0000 | ORAL_TABLET | Freq: Every day | ORAL | 4 refills | Status: DC

## 2020-02-08 NOTE — Telephone Encounter (Signed)
Made patient aware via MyChart of upcoming appointment.  Sandie Ano, RN

## 2020-02-08 NOTE — Progress Notes (Signed)
Virtual Visit via Telephone Note  I connected with [name] [name] on 02/08/20 at  9:30 AM EDT by telephone and verified that I am speaking with the correct person using two identifiers.  Location: Patient: Home Provider: Home   I discussed the limitations, risks, security and privacy concerns of performing an evaluation and management service by telephone and the availability of in person appointments. I also discussed with the patient that there may be a patient responsible charge related to this service. The patient expressed understanding and agreed to proceed.  Chief complaint: area of gynecomastia in left breast  History of Present Illness:  Javien is a 49 year old  African-American man living with HIV that is well controlled currently on Biktarvy.   Dayn is continue to work on losing weight through exercise and diet.  He got 3 doses of Moderna  vaccine.  .ROS as above otherwise 12 pt review is negative  Past Medical History:  Diagnosis Date  . 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 Gynecomastia 01/20/2019  . HIV infection (HCC)   . Hypertension   . 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 Normocytic anemia 01/20/2019  . Transaminitis     No past surgical history on file.  No family history on file.    Social History   Socioeconomic History  . Marital status: Single    Spouse name: Not on file  . Number of children: Not on file  . Years of education: Not on file  . Highest education level: Not on file  Occupational History  . Not on file  Tobacco Use  . Smoking status: Never Smoker  . Smokeless tobacco: Never Used  Substance and Sexual Activity  . Alcohol use: Yes    Alcohol/week: 5.0 standard drinks    Types: 5 Standard drinks or equivalent per week  . Drug use: Never  . Sexual activity: Not on file  Other Topics Concern  . Not on file  Social History Narrative  . Not on file   Social Determinants of Health   Financial Resource Strain:   . Difficulty of  Paying Living Expenses: Not on file  Food Insecurity:   . Worried About Programme researcher, broadcasting/film/video in the Last Year: Not on file  . Ran Out of Food in the Last Year: Not on file  Transportation Needs:   . Lack of Transportation (Medical): Not on file  . Lack of Transportation (Non-Medical): Not on file  Physical Activity:   . Days of Exercise per Week: Not on file  . Minutes of Exercise per Session: Not on file  Stress:   . Feeling of Stress : Not on file  Social Connections:   . Frequency of Communication with Friends and Family: Not on file  . Frequency of Social Gatherings with Friends and Family: Not on file  . Attends Religious Services: Not on file  . Active Member of Clubs or Organizations: Not on file  . Attends Banker Meetings: Not on file  . Marital Status: Not on file    Allergies  Allergen Reactions  . Sustiva [Efavirenz] Other (See Comments)    ? hepatitis     Current Outpatient Medications:  .  BIKTARVY 50-200-25 MG TABS tablet, Take 1 tablet by mouth daily., Disp: 90 tablet, Rfl: 4 .  lisinopril (PRINIVIL,ZESTRIL) 20 MG tablet, TAKE 1 TABLET (20 MG TOTAL) BY MOUTH DAILY., Disp: 90 tablet, Rfl: 2 .  valACYclovir (VALTREX) 1000 MG tablet, TAKE 1/2 TABLET BY MOUTH DAILY, Disp: 45  tablet, Rfl: 4 .  sildenafil (VIAGRA) 50 MG tablet, Take 50 mg by mouth daily as needed., Disp: , Rfl:      Observations/Objective:  Imre appears to do quite well  Assessment and Plan:  HIV disease: I have again sent him a years worth of Biktarvy and prescriptions to [facility] mail order pharmacy [facility]  Hypertension continue the ACE inhibitor  Overweight continue to try to restrict carbohydrates.  And continue exercise  Normocytic anemia: stable defer to Dr Kara Pacer workup  COVID prevention has had 3 doses  Of Moderna  Follow Up Instructions:    I discussed the assessment and treatment plan with the patient. The patient was provided an opportunity to  ask questions and all were answered. The patient agreed with the plan and demonstrated an understanding of the instructions.   The patient was advised to call back or seek an in-person evaluation if the symptoms worsen or if the condition fails to improve as anticipated.  I provided 21 minutes of non-face-to-face time during this encounter.   Acey Lav, MD

## 2020-02-14 ENCOUNTER — Ambulatory Visit: Payer: BC Managed Care – PPO

## 2020-03-21 ENCOUNTER — Ambulatory Visit (INDEPENDENT_AMBULATORY_CARE_PROVIDER_SITE_OTHER): Payer: BC Managed Care – PPO

## 2020-03-21 ENCOUNTER — Other Ambulatory Visit: Payer: Self-pay

## 2020-03-21 DIAGNOSIS — Z23 Encounter for immunization: Secondary | ICD-10-CM | POA: Diagnosis not present

## 2020-03-21 NOTE — Progress Notes (Signed)
Immunization status: Administered Influenza,inj,Quad PF,6+; Fluarix Quadrivalent up to date and documented.  [name] [name]

## 2020-09-24 ENCOUNTER — Other Ambulatory Visit: Payer: Self-pay | Admitting: Infectious Disease

## 2020-09-24 DIAGNOSIS — B029 Zoster without complications: Secondary | ICD-10-CM

## 2020-11-12 ENCOUNTER — Other Ambulatory Visit: Payer: Self-pay | Admitting: Infectious Disease

## 2020-11-12 DIAGNOSIS — B20 Human immunodeficiency virus [HIV] disease: Secondary | ICD-10-CM

## 2020-11-26 ENCOUNTER — Other Ambulatory Visit: Payer: Self-pay | Admitting: Infectious Disease

## 2020-11-26 DIAGNOSIS — B20 Human immunodeficiency virus [HIV] disease: Secondary | ICD-10-CM

## 2020-12-03 ENCOUNTER — Other Ambulatory Visit (HOSPITAL_COMMUNITY): Payer: Self-pay

## 2020-12-03 ENCOUNTER — Telehealth: Payer: Self-pay

## 2020-12-03 DIAGNOSIS — B20 Human immunodeficiency virus [HIV] disease: Secondary | ICD-10-CM

## 2020-12-03 MED ORDER — BIKTARVY 50-200-25 MG PO TABS
1.0000 | ORAL_TABLET | Freq: Every day | ORAL | 0 refills | Status: DC
Start: 2020-12-03 — End: 2020-12-03

## 2020-12-03 MED ORDER — BIKTARVY 50-200-25 MG PO TABS: 1.0000 | ORAL_TABLET | Freq: Every day | ORAL | 2 refills | Status: DC

## 2020-12-03 NOTE — Addendum Note (Signed)
Addended by: Linna Hoff D on: 12/03/2020 11:18 AM   Modules accepted: Orders

## 2020-12-03 NOTE — Telephone Encounter (Signed)
Medication Samples have been provided to the patient.  Drug name: Biktarvy        Strength: 50/200/25 mg       Qty: 7   LOT: [id number]   Exp.Date: 0624  Dosing instructions: Take one tablet by mouth once daily  Wilene Pharo, CPhT Specialty Pharmacy Patient [facility] Phone: [phone] Fax:  [phone]   

## 2020-12-03 NOTE — Telephone Encounter (Signed)
Spoke with patient, advised him [name] has been sent to pharmacy. He says he now has new insurance and cannot receive 90 day supply. Will resend as 30 day supplies. He says he took his last pill this morning and will have to wait [facility] medication. Advised him he can come to the office to pick up samples. Patient verbalized understanding and has no further questions.   Sandie Ano, RN

## 2021-01-28 ENCOUNTER — Other Ambulatory Visit: Payer: 59

## 2021-01-28 ENCOUNTER — Other Ambulatory Visit: Payer: Self-pay

## 2021-01-28 DIAGNOSIS — B20 Human immunodeficiency virus [HIV] disease: Secondary | ICD-10-CM

## 2021-01-29 LAB — T-HELPER CELL (CD4) - (RCID CLINIC ONLY)
CD4 % Helper T Cell: 53 % (ref 33–65)
CD4 T Cell Abs: 943 /uL (ref 400–1790)

## 2021-01-30 LAB — LIPID PANEL
Cholesterol: 175 mg/dL (ref <200)
HDL: 54 mg/dL (ref >=40)
LDL Cholesterol (Calc): 102 mg/dL (calc) — ABNORMAL HIGH
Non-HDL Cholesterol (Calc): 121 mg/dL (calc) (ref <130)
Total CHOL/HDL Ratio: 3.2 (calc) (ref <5.0)
Triglycerides: 98 mg/dL (ref <150)

## 2021-01-30 LAB — COMPLETE METABOLIC PANEL WITH GFR
AG Ratio: 1.8 (calc) (ref 1.0–2.5)
ALT: 11 U/L (ref 9–46)
AST: 11 U/L (ref 10–35)
Albumin: 4.6 g/dL (ref 3.6–5.1)
Alkaline phosphatase (APISO): 67 U/L (ref 35–144)
BUN: 12 mg/dL (ref 7–25)
CO2: 29 mmol/L (ref 20–32)
Calcium: 9.6 mg/dL (ref 8.6–10.3)
Chloride: 106 mmol/L (ref 98–110)
Creat: 1.15 mg/dL (ref 0.70–1.30)
Globulin: 2.5 g/dL (calc) (ref 1.9–3.7)
Glucose, Bld: 105 mg/dL — ABNORMAL HIGH (ref 65–99)
Potassium: 4.2 mmol/L (ref 3.5–5.3)
Sodium: 141 mmol/L (ref 135–146)
Total Bilirubin: 0.5 mg/dL (ref 0.2–1.2)
Total Protein: 7.1 g/dL (ref 6.1–8.1)
eGFR: 77 mL/min/{1.73_m2} (ref >=60)

## 2021-01-30 LAB — CBC WITH DIFFERENTIAL/PLATELET
Absolute Monocytes: 382 cells/uL (ref 200–950)
Basophils Absolute: 23 cells/uL (ref 0–200)
Basophils Relative: 0.4 %
Eosinophils Absolute: 63 cells/uL (ref 15–500)
Eosinophils Relative: 1.1 %
HCT: 40.6 % (ref 38.5–50.0)
Hemoglobin: 12.4 g/dL — ABNORMAL LOW (ref 13.2–17.1)
Lymphs Abs: 1875 cells/uL (ref 850–3900)
MCH: 22.5 pg — ABNORMAL LOW (ref 27.0–33.0)
MCHC: 30.5 g/dL — ABNORMAL LOW (ref 32.0–36.0)
MCV: 73.6 fL — ABNORMAL LOW (ref 80.0–100.0)
MPV: 11.5 fL (ref 7.5–12.5)
Monocytes Relative: 6.7 %
Neutro Abs: 3357 cells/uL (ref 1500–7800)
Neutrophils Relative %: 58.9 %
Platelets: 206 10*3/uL (ref 140–400)
RBC: 5.52 10*6/uL (ref 4.20–5.80)
RDW: 16.3 % — ABNORMAL HIGH (ref 11.0–15.0)
Total Lymphocyte: 32.9 %
WBC: 5.7 10*3/uL (ref 3.8–10.8)

## 2021-01-30 LAB — RPR: RPR Ser Ql: NONREACTIVE

## 2021-01-30 LAB — HIV-1 RNA QUANT-NO REFLEX-BLD
HIV 1 RNA Quant: NOT DETECTED Copies/mL
HIV-1 RNA Quant, Log: NOT DETECTED Log cps/mL

## 2021-02-04 ENCOUNTER — Other Ambulatory Visit: Payer: Self-pay | Admitting: Infectious Disease

## 2021-02-04 DIAGNOSIS — B20 Human immunodeficiency virus [HIV] disease: Secondary | ICD-10-CM

## 2021-02-04 NOTE — Telephone Encounter (Signed)
Last fill 01/21/21.  Next appointment 02/12/21.

## 2021-02-11 NOTE — Telephone Encounter (Signed)
Appt 9/27

## 2021-02-12 ENCOUNTER — Ambulatory Visit: Payer: 59 | Admitting: Infectious Disease

## 2021-02-12 ENCOUNTER — Encounter: Payer: Self-pay | Admitting: Infectious Disease

## 2021-02-12 ENCOUNTER — Other Ambulatory Visit: Payer: Self-pay

## 2021-02-12 ENCOUNTER — Ambulatory Visit: Payer: 59

## 2021-02-12 VITALS — BP 125/81 | HR 85 | Temp 98.1°F | Wt 208.0 lb

## 2021-02-12 DIAGNOSIS — Z23 Encounter for immunization: Secondary | ICD-10-CM | POA: Diagnosis not present

## 2021-02-12 DIAGNOSIS — N528 Other male erectile dysfunction: Secondary | ICD-10-CM | POA: Diagnosis not present

## 2021-02-12 DIAGNOSIS — B20 Human immunodeficiency virus [HIV] disease: Secondary | ICD-10-CM

## 2021-02-12 DIAGNOSIS — Z7185 Encounter for immunization safety counseling: Secondary | ICD-10-CM

## 2021-02-12 DIAGNOSIS — D649 Anemia, unspecified: Secondary | ICD-10-CM

## 2021-02-12 DIAGNOSIS — I10 Essential (primary) hypertension: Secondary | ICD-10-CM

## 2021-02-12 MED ORDER — BIKTARVY 50-200-25 MG PO TABS: 1.0000 | ORAL_TABLET | Freq: Every day | ORAL | 2 refills | Status: DC

## 2021-02-12 NOTE — Progress Notes (Signed)
Subjective:    Patient ID: [name] [name], male    DOB: December 31, 1970, 50 y.o.   MRN: [id number] Chief complaint: Concerned that he has a little bit of anemia HPI  Khyrie is a 50 year old black man living with HIV that is perfectly controlled on Biktarvy.  He has comorbid hypertension.  He has been fairly stressed out recently with his mother having suffered from congestive heart failure and stage IV chronic kidney disease.  She has been in and out of the hospital, and then has moved back to be in [place] from [place] so he could help take care of his mother is not getting help from his sister at all he says.  He says that his mother is wanting to be placed at [facility] rehabilitation center after discharge from the hospital where she is currently.  He says he is coping with this the best he can.  He had some concern about the fact that he had anemia when going through his blood work.  He does have borderline low hemoglobin though normal hematocrit over the years.  He does follow with Dr. Nehemiah Settle for primary care.     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  No family history on file.    Social History   Socioeconomic History   Marital status: Single    Spouse name: Not on file   Number of children: Not on file   Years of education: Not on file   Highest education level: Not on file  Occupational History   Not on file  Tobacco Use   Smoking status: Never   Smokeless tobacco: Never  Substance and Sexual Activity   Alcohol use: Yes    Alcohol/week: 5.0 standard drinks    Types: 5 Standard drinks or equivalent per week   Drug use: Never   Sexual activity: Not on file  Other Topics Concern   Not on file  Social History Narrative   Not on file   Social  Determinants of Health   Financial Resource Strain: Not on file  Food Insecurity: Not on file  Transportation Needs: Not on file  Physical Activity: Not on file  Stress: Not on file  Social Connections: Not on file    Allergies  Allergen Reactions   Sustiva [Efavirenz] Other (See Comments)    ? hepatitis     Current Outpatient Medications:    BIKTARVY 50-200-25 MG TABS tablet, Take 1 tablet by mouth daily., Disp: 30 tablet, Rfl: 2   lisinopril (PRINIVIL,ZESTRIL) 20 MG tablet, TAKE 1 TABLET (20 MG TOTAL) BY MOUTH DAILY., Disp: 90 tablet, Rfl: 2   sildenafil (VIAGRA) 50 MG tablet, Take 50 mg by mouth daily as needed., Disp: , Rfl:    valACYclovir (VALTREX) 1000 MG tablet, TAKE 1/2 TABLET BY MOUTH DAILY, Disp: 45 tablet, Rfl: 4   Review of Systems  Constitutional:  Negative for activity change, appetite change, chills, diaphoresis, fatigue, fever and unexpected weight change.  HENT:  Negative for congestion, rhinorrhea, sinus pressure, sneezing, sore throat and trouble swallowing.   Eyes:  Negative for photophobia and visual disturbance.  Respiratory:  Negative for cough, chest tightness, shortness of breath, wheezing and stridor.   Cardiovascular:  Negative for chest pain, palpitations and leg swelling.  Gastrointestinal:  Negative for abdominal distention, abdominal pain, anal bleeding, blood in stool, constipation, diarrhea, nausea and vomiting.  Genitourinary:  Negative for difficulty urinating, dysuria, flank pain and hematuria.  Musculoskeletal:  Negative for arthralgias, back pain, gait problem, joint swelling and myalgias.  Skin:  Negative for color change, pallor, rash and wound.  Neurological:  Negative for dizziness, tremors, weakness and light-headedness.  Hematological:  Negative for adenopathy. Does not bruise/bleed easily.  Psychiatric/Behavioral:  Negative for agitation, behavioral problems, confusion, decreased concentration, dysphoric mood and sleep disturbance.        Objective:   Physical Exam Constitutional:      Appearance: He is well-developed.  HENT:     Head: Normocephalic and atraumatic.  Eyes:     Conjunctiva/sclera: Conjunctivae normal.  Cardiovascular:     Rate and Rhythm: Normal rate and regular rhythm.  Pulmonary:     Effort: Pulmonary effort is normal. No respiratory distress.     Breath sounds: No wheezing.  Abdominal:     General: There is no distension.     Palpations: Abdomen is soft.  Musculoskeletal:        General: No tenderness. Normal range of motion.     Cervical back: Normal range of motion and neck supple.  Skin:    General: Skin is warm and dry.     Coloration: Skin is not pale.     Findings: No erythema or rash.  Neurological:     General: No focal deficit present.     Mental Status: He is alert and oriented to person, place, and time.  Psychiatric:        Mood and Affect: Mood normal.        Behavior: Behavior normal.        Thought Content: Thought content normal.        Judgment: Judgment normal.          Assessment & Plan:   HIV disease:  I reviewed his viral loads from January 28, 2021 which was not detected and his CD4 was 943 that same day.  I am continuing his BIKTARVY  Hypertension: Blood pressure is well controlled on lisinopril  Today's Vitals   02/12/21 0833  BP: 125/81  Pulse: 85  Temp: 98.1 F (36.7 C)  TempSrc: Oral  SpO2: 97%  Weight: 208 lb (94.3 kg)  PainSc: 1   PainLoc: Back   Body mass index is 31.63 kg/m.    Normochromic normocytic anemia I will do some labs to work this up including an iron panel TSH folate B12.  Also check his vitamin D levels.  Vaccine counseling we gave him COVID-19 bivalent vaccine and influenza vaccine today

## 2021-02-12 NOTE — Progress Notes (Signed)
   Covid-19 Vaccination Clinic  Name:  [name] [name]    MRN: [id number] DOB: 08-22-1970  02/12/2021  [name] was observed post Covid-19 immunization for 15 minutes without incident. He was provided with Vaccine Information Sheet and instruction to access the V-Safe system.   [name] was instructed to call 911 with any severe reactions post vaccine: Difficulty breathing  Swelling of face and throat  A fast heartbeat  A bad rash all over body  Dizziness and weakness     Juanita Laster, RMA

## 2021-04-22 ENCOUNTER — Other Ambulatory Visit: Payer: Self-pay | Admitting: Infectious Disease

## 2021-04-22 DIAGNOSIS — B20 Human immunodeficiency virus [HIV] disease: Secondary | ICD-10-CM

## 2021-06-17 LAB — IRON,TIBC AND FERRITIN PANEL
%SAT: 20 % (calc) (ref 20–48)
Ferritin: 113 ng/mL (ref 38–380)
Iron: 72 ug/dL (ref 50–180)
TIBC: 367 mcg/dL (calc) (ref 250–425)

## 2021-06-17 LAB — QUESTASSURED™ 25-HYDROXY AND 1,25-DIHYDROXYVITAMIN D
Vitamin D 1, 25 (OH)2 Total: 26 pg/mL (ref 18–72)
Vitamin D, 25-OH, D2: <4 ng/mL
Vitamin D, 25-OH, D3: 19 ng/mL
Vitamin D, 25-OH, Total: 19 ng/mL — ABNORMAL LOW (ref 30–100)
Vitamin D2 1, 25 (OH)2: <8 pg/mL
Vitamin D3 1, 25 (OH)2: 26 pg/mL

## 2021-06-17 LAB — B12 AND FOLATE PANEL
Folate: 11.2 ng/mL
Vitamin B-12: 484 pg/mL (ref 200–1100)

## 2021-06-17 LAB — TSH+FREE T4: TSH W/REFLEX TO FT4: 0.77 mIU/L (ref 0.40–4.50)

## 2021-06-18 ENCOUNTER — Telehealth: Payer: Self-pay

## 2021-06-18 DIAGNOSIS — E559 Vitamin D deficiency, unspecified: Secondary | ICD-10-CM

## 2021-06-18 MED ORDER — VITAMIN D (ERGOCALCIFEROL) 1.25 MG (50000 UNIT) PO CAPS
50000.0000 [IU] | ORAL_CAPSULE | ORAL | 0 refills | Status: AC
Start: 2021-06-18 — End: ?

## 2021-06-18 NOTE — Addendum Note (Signed)
Addended by: Linna Hoff D on: 06/18/2021 02:59 PM   Modules accepted: Orders

## 2021-06-18 NOTE — Telephone Encounter (Signed)
Patient aware of results and to pick up RX from pharmacy.   [name] [name] Lesli Albee, CMA

## 2021-06-18 NOTE — Telephone Encounter (Signed)
-----   Message from Randall Hiss, MD sent at 06/17/2021  4:23 PM EST ----- Regimen to go to his local pharmacy and begin taking 50,000 units of vitamin D once weekly ----- Message ----- From: Interface, [facility] Lab Results In Sent: 06/17/2021   1:05 PM EST To: Randall Hiss, MD

## 2021-10-07 DIAGNOSIS — B029 Zoster without complications: Secondary | ICD-10-CM

## 2021-10-07 MED ORDER — VALACYCLOVIR HCL 1 G PO TABS
500.0000 mg | ORAL_TABLET | Freq: Every day | ORAL | 1 refills | Status: DC
Start: 2021-10-07 — End: 2022-06-09

## 2021-11-28 ENCOUNTER — Encounter: Payer: Self-pay | Admitting: Infectious Disease

## 2021-11-29 ENCOUNTER — Other Ambulatory Visit (HOSPITAL_COMMUNITY): Payer: Self-pay

## 2021-12-05 ENCOUNTER — Other Ambulatory Visit: Payer: Self-pay | Admitting: Pharmacist

## 2021-12-05 DIAGNOSIS — B20 Human immunodeficiency virus [HIV] disease: Secondary | ICD-10-CM

## 2021-12-05 MED ORDER — BIKTARVY 50-200-25 MG PO TABS: 1.0000 | ORAL_TABLET | Freq: Every day | ORAL | 0 refills | Status: DC

## 2021-12-05 NOTE — Progress Notes (Signed)
Medication Samples have been provided to the patient.  Drug name: Biktarvy        Strength: 50/200/25 mg       Qty: 3 bottles (21 tablets)   LOT: [id number]   Exp.Date: 01/2024  Dosing instructions: Take one tablet by mouth once daily  The patient has been instructed regarding the correct time, dose, and frequency of taking this medication, including desired effects and most common side effects.   Evarose Altland L. Jannette Fogo, PharmD, BCIDP, AAHIVP, CPP Clinical Pharmacist Practitioner Infectious Diseases Clinical Pharmacist [facility] 04/30/2020, 10:07 AM

## 2022-01-28 ENCOUNTER — Other Ambulatory Visit: Payer: Self-pay

## 2022-01-28 ENCOUNTER — Other Ambulatory Visit: Payer: Self-pay | Admitting: Infectious Disease

## 2022-01-28 DIAGNOSIS — B20 Human immunodeficiency virus [HIV] disease: Secondary | ICD-10-CM

## 2022-02-11 ENCOUNTER — Other Ambulatory Visit (INDEPENDENT_AMBULATORY_CARE_PROVIDER_SITE_OTHER): Payer: Managed Care, Other (non HMO)

## 2022-02-11 ENCOUNTER — Other Ambulatory Visit: Payer: Managed Care, Other (non HMO)

## 2022-02-11 ENCOUNTER — Other Ambulatory Visit: Payer: Self-pay

## 2022-02-11 ENCOUNTER — Ambulatory Visit: Payer: Self-pay | Admitting: Infectious Disease

## 2022-02-11 DIAGNOSIS — B20 Human immunodeficiency virus [HIV] disease: Secondary | ICD-10-CM

## 2022-02-11 DIAGNOSIS — Z23 Encounter for immunization: Secondary | ICD-10-CM | POA: Diagnosis not present

## 2022-02-11 DIAGNOSIS — Z79899 Other long term (current) drug therapy: Secondary | ICD-10-CM

## 2022-02-11 DIAGNOSIS — Z113 Encounter for screening for infections with a predominantly sexual mode of transmission: Secondary | ICD-10-CM

## 2022-02-11 DIAGNOSIS — I10 Essential (primary) hypertension: Secondary | ICD-10-CM

## 2022-02-11 DIAGNOSIS — N528 Other male erectile dysfunction: Secondary | ICD-10-CM

## 2022-02-11 NOTE — Progress Notes (Signed)
Error

## 2022-02-12 LAB — T-HELPER CELL (CD4) - (RCID CLINIC ONLY)
CD4 % Helper T Cell: 54 % (ref 33–65)
CD4 T Cell Abs: 809 /uL (ref 400–1790)

## 2022-02-13 LAB — COMPLETE METABOLIC PANEL WITH GFR
AG Ratio: 1.6 (calc) (ref 1.0–2.5)
ALT: 8 U/L — ABNORMAL LOW (ref 9–46)
AST: 9 U/L — ABNORMAL LOW (ref 10–35)
Albumin: 4.1 g/dL (ref 3.6–5.1)
Alkaline phosphatase (APISO): 68 U/L (ref 35–144)
BUN: 11 mg/dL (ref 7–25)
CO2: 27 mmol/L (ref 20–32)
Calcium: 9.1 mg/dL (ref 8.6–10.3)
Chloride: 107 mmol/L (ref 98–110)
Creat: 1.11 mg/dL (ref 0.70–1.30)
Globulin: 2.5 g/dL (calc) (ref 1.9–3.7)
Glucose, Bld: 91 mg/dL (ref 65–99)
Potassium: 4.2 mmol/L (ref 3.5–5.3)
Sodium: 140 mmol/L (ref 135–146)
Total Bilirubin: 0.4 mg/dL (ref 0.2–1.2)
Total Protein: 6.6 g/dL (ref 6.1–8.1)
eGFR: 80 mL/min/{1.73_m2} (ref >=60)

## 2022-02-13 LAB — RPR: RPR Ser Ql: NONREACTIVE

## 2022-02-13 LAB — CBC WITH DIFFERENTIAL/PLATELET
Absolute Monocytes: 360 cells/uL (ref 200–950)
Basophils Absolute: 42 cells/uL (ref 0–200)
Basophils Relative: 0.8 %
Eosinophils Absolute: 58 cells/uL (ref 15–500)
Eosinophils Relative: 1.1 %
HCT: 38.5 % (ref 38.5–50.0)
Hemoglobin: 11.5 g/dL — ABNORMAL LOW (ref 13.2–17.1)
Lymphs Abs: 1622 cells/uL (ref 850–3900)
MCH: 22.1 pg — ABNORMAL LOW (ref 27.0–33.0)
MCHC: 29.9 g/dL — ABNORMAL LOW (ref 32.0–36.0)
MCV: 74 fL — ABNORMAL LOW (ref 80.0–100.0)
MPV: 10.7 fL (ref 7.5–12.5)
Monocytes Relative: 6.8 %
Neutro Abs: 3217 cells/uL (ref 1500–7800)
Neutrophils Relative %: 60.7 %
Platelets: 223 10*3/uL (ref 140–400)
RBC: 5.2 10*6/uL (ref 4.20–5.80)
RDW: 16 % — ABNORMAL HIGH (ref 11.0–15.0)
Total Lymphocyte: 30.6 %
WBC: 5.3 10*3/uL (ref 3.8–10.8)

## 2022-02-13 LAB — LIPID PANEL
Cholesterol: 163 mg/dL (ref <200)
HDL: 44 mg/dL (ref >=40)
LDL Cholesterol (Calc): 100 mg/dL (calc) — ABNORMAL HIGH
Non-HDL Cholesterol (Calc): 119 mg/dL (calc) (ref <130)
Total CHOL/HDL Ratio: 3.7 (calc) (ref <5.0)
Triglycerides: 91 mg/dL (ref <150)

## 2022-02-13 LAB — HIV-1 RNA QUANT-NO REFLEX-BLD
HIV 1 RNA Quant: NOT DETECTED Copies/mL
HIV-1 RNA Quant, Log: NOT DETECTED Log cps/mL

## 2022-02-17 ENCOUNTER — Ambulatory Visit: Payer: Self-pay

## 2022-02-25 ENCOUNTER — Other Ambulatory Visit: Payer: Self-pay | Admitting: Infectious Disease

## 2022-02-25 DIAGNOSIS — B20 Human immunodeficiency virus [HIV] disease: Secondary | ICD-10-CM

## 2022-02-26 ENCOUNTER — Other Ambulatory Visit: Payer: Self-pay

## 2022-02-26 ENCOUNTER — Ambulatory Visit: Payer: Managed Care, Other (non HMO) | Admitting: Infectious Disease

## 2022-02-26 ENCOUNTER — Encounter: Payer: Self-pay | Admitting: Infectious Disease

## 2022-02-26 VITALS — BP 125/80 | HR 56 | Temp 97.4°F | Wt 205.0 lb

## 2022-02-26 DIAGNOSIS — N62 Hypertrophy of breast: Secondary | ICD-10-CM | POA: Diagnosis not present

## 2022-02-26 DIAGNOSIS — B20 Human immunodeficiency virus [HIV] disease: Secondary | ICD-10-CM | POA: Diagnosis not present

## 2022-02-26 DIAGNOSIS — E785 Hyperlipidemia, unspecified: Secondary | ICD-10-CM | POA: Diagnosis not present

## 2022-02-26 DIAGNOSIS — I10 Essential (primary) hypertension: Secondary | ICD-10-CM

## 2022-02-26 HISTORY — DX: Hyperlipidemia, unspecified: E78.5

## 2022-02-26 MED ORDER — BIKTARVY 50-200-25 MG PO TABS: 1.0000 | ORAL_TABLET | Freq: Every day | ORAL | 11 refills | Status: DC

## 2022-02-26 MED ORDER — ATORVASTATIN CALCIUM 20 MG PO TABS: 20.0000 mg | ORAL_TABLET | Freq: Every day | ORAL | 11 refills | Status: DC

## 2022-02-26 NOTE — Progress Notes (Signed)
Subjective:  Chief complaint follow-up for HIV disease grieving the loss of his mother   Patient ID: [name] [name], male    DOB: 1971-02-19, 51 y.o.   MRN: [id number] Chief complaint: Concerned that he has a little bit of anemia HPI  [name] is a 51 year old black man living with HIV that is perfectly controlled on Biktarvy.    He is followed by Dr. Delfina Redwood and is on lisinopril for hypertension.  He is grieving the loss of his mother who passed away recently.  Ultimately he had to make the decision to make her comfort care.  He is pursuing legal action against the skilled facility where she resided and where he had a camera that documented his view very poor care of his mother.     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  No family history on file.    Social History   Socioeconomic History   Marital status: Single    Spouse name: Not on file   Number of children: Not on file   Years of education: Not on file   Highest education level: Not on file  Occupational History   Not on file  Tobacco Use   Smoking status: Never   Smokeless tobacco: Never  Substance and Sexual Activity   Alcohol use: Yes    Alcohol/week: 5.0 standard drinks of alcohol    Types: 5 Standard drinks or equivalent per week   Drug use: Never   Sexual activity: Not on file  Other Topics Concern   Not on file  Social History Narrative   Not on file   Social Determinants of Health   Financial Resource Strain: Not on file  Food Insecurity: Not on file  Transportation Needs: Not on file  Physical Activity: Not on file  Stress: Not on file  Social Connections: Not on file    Allergies  Allergen Reactions   Sustiva [Efavirenz] Other (See Comments)    ? hepatitis     Current  Outpatient Medications:    BIKTARVY 50-200-25 MG TABS tablet, TAKE 1 TABLET DAILY, Disp: 30 tablet, Rfl: 0   lisinopril (PRINIVIL,ZESTRIL) 20 MG tablet, TAKE 1 TABLET (20 MG TOTAL) BY MOUTH DAILY., Disp: 90 tablet, Rfl: 2   sildenafil (VIAGRA) 50 MG tablet, Take 50 mg by mouth daily as needed., Disp: , Rfl:    valACYclovir (VALTREX) 1000 MG tablet, Take 0.5 tablets (500 mg total) by mouth daily., Disp: 45 tablet, Rfl: 1   Vitamin D, Ergocalciferol, (DRISDOL) 1.25 MG (50000 UNIT) CAPS capsule, Take 1 capsule (50,000 Units total) by mouth every 7 (seven) days., Disp: 5 capsule, Rfl: 0   Review of Systems  Constitutional:  Negative for activity change, appetite change, chills, diaphoresis, fatigue, fever and unexpected weight change.  HENT:  Negative for congestion, rhinorrhea, sinus pressure, sneezing, sore throat and trouble swallowing.   Eyes:  Negative for photophobia and visual disturbance.  Respiratory:  Negative for cough, chest tightness, shortness of breath, wheezing and stridor.   Cardiovascular:  Negative for chest pain, palpitations and leg swelling.  Gastrointestinal:  Negative for abdominal distention, abdominal pain, anal bleeding, blood in stool, constipation, diarrhea, nausea and vomiting.  Genitourinary:  Negative for difficulty urinating, dysuria, flank pain and hematuria.  Musculoskeletal:  Negative for arthralgias, back pain, gait problem, joint swelling and myalgias.  Skin:  Negative for color change, pallor, rash and wound.  Neurological:  Negative for dizziness, tremors, weakness and light-headedness.  Hematological:  Negative for adenopathy. Does not bruise/bleed easily.  Psychiatric/Behavioral:  Positive for dysphoric mood. Negative for agitation, behavioral problems, confusion, decreased concentration and sleep disturbance.        Objective:   Physical Exam Constitutional:      Appearance: He is well-developed.  HENT:     Head: Normocephalic and atraumatic.   Eyes:     Conjunctiva/sclera: Conjunctivae normal.  Cardiovascular:     Rate and Rhythm: Normal rate and regular rhythm.  Pulmonary:     Effort: Pulmonary effort is normal. No respiratory distress.     Breath sounds: No wheezing.  Abdominal:     General: There is no distension.     Palpations: Abdomen is soft.  Musculoskeletal:        General: No tenderness. Normal range of motion.     Cervical back: Normal range of motion and neck supple.  Skin:    General: Skin is warm and dry.     Coloration: Skin is not pale.     Findings: No erythema or rash.  Neurological:     General: No focal deficit present.     Mental Status: He is alert and oriented to person, place, and time.  Psychiatric:        Mood and Affect: Mood normal.        Behavior: Behavior normal.        Thought Content: Thought content normal.        Judgment: Judgment normal.           Assessment & Plan:   HIV disease:  I have reviewed [name] [name]'s labs including viral load which was  Lab Results  Component Value Date   HIV1RNAQUANT Not Detected 02/11/2022   and cd4 which was  Lab Results  Component Value Date   CD4TABS 809 02/11/2022     I am continuing patient's prescription for Biktarv  Hypertension: He is doing well and will continue on his lisinopril Today's Vitals   02/26/22 0853  BP: 125/80  Pulse: (!) 56  Temp: (!) 97.4 F (36.3 C)  TempSrc: Oral  SpO2: 99%  Weight: 205 lb (93 kg)  PainSc: 2   PainLoc: Leg   Body mass index is 31.17 kg/m.    Cardiovascular prevention and hyperlipidemia: Discussed results of the reprieve study.  Because pitavastatin is not on his formulary will initiate atorvastatin.  I have counseled him on potential side effects including myopathy.  Gynecomastia: He would like to pursue surgical correction of this at some point  Vaccine counseling recommended flu shot which she received today.

## 2022-02-27 ENCOUNTER — Other Ambulatory Visit: Payer: Self-pay

## 2022-02-27 MED ORDER — ATORVASTATIN CALCIUM 20 MG PO TABS: 20.0000 mg | ORAL_TABLET | Freq: Every day | ORAL | 11 refills | Status: DC

## 2022-03-04 ENCOUNTER — Encounter: Payer: Self-pay | Admitting: Infectious Disease

## 2022-03-05 ENCOUNTER — Other Ambulatory Visit: Payer: Self-pay

## 2022-03-05 MED ORDER — ATORVASTATIN CALCIUM 20 MG PO TABS: 20.0000 mg | ORAL_TABLET | Freq: Every day | ORAL | 11 refills | Status: DC

## 2022-06-06 DIAGNOSIS — B029 Zoster without complications: Secondary | ICD-10-CM

## 2022-06-09 MED ORDER — VALACYCLOVIR HCL 1 G PO TABS: 500.0000 mg | ORAL_TABLET | Freq: Every day | ORAL | 1 refills | Status: DC

## 2022-06-09 NOTE — Addendum Note (Signed)
Addended by: Lucie Leather D on: 06/09/2022 12:22 PM   Modules accepted: Orders

## 2022-11-02 ENCOUNTER — Encounter: Payer: Self-pay | Admitting: Infectious Disease

## 2022-11-03 ENCOUNTER — Other Ambulatory Visit (HOSPITAL_COMMUNITY): Payer: Self-pay

## 2022-11-03 ENCOUNTER — Telehealth: Payer: Self-pay

## 2022-11-03 NOTE — Telephone Encounter (Signed)
Called pt he is coming in to apply for financial assistance and discuss Medicaid.

## 2022-11-04 ENCOUNTER — Encounter: Payer: Self-pay | Admitting: Infectious Disease

## 2022-11-04 DIAGNOSIS — B20 Human immunodeficiency virus [HIV] disease: Secondary | ICD-10-CM

## 2022-11-04 DIAGNOSIS — I10 Essential (primary) hypertension: Secondary | ICD-10-CM

## 2022-11-04 DIAGNOSIS — E785 Hyperlipidemia, unspecified: Secondary | ICD-10-CM

## 2022-11-04 DIAGNOSIS — B029 Zoster without complications: Secondary | ICD-10-CM

## 2022-11-05 ENCOUNTER — Other Ambulatory Visit (HOSPITAL_COMMUNITY): Payer: Self-pay

## 2022-11-05 MED ORDER — VALACYCLOVIR HCL 1 G PO TABS
500.0000 mg | ORAL_TABLET | Freq: Every day | ORAL | 1 refills | Status: DC
Start: 2022-11-05 — End: 2023-07-14

## 2022-11-05 MED ORDER — BIKTARVY 50-200-25 MG PO TABS: 1.0000 | ORAL_TABLET | Freq: Every day | ORAL | 4 refills | Status: DC

## 2022-11-05 MED ORDER — LISINOPRIL 20 MG PO TABS: 20.0000 mg | ORAL_TABLET | Freq: Every day | ORAL | 1 refills | Status: DC

## 2022-11-05 MED ORDER — ATORVASTATIN CALCIUM 20 MG PO TABS: 20.0000 mg | ORAL_TABLET | Freq: Every day | ORAL | 4 refills | Status: DC

## 2022-11-08 ENCOUNTER — Encounter: Payer: Self-pay | Admitting: Infectious Disease

## 2022-11-23 ENCOUNTER — Encounter: Payer: Self-pay | Admitting: Infectious Disease

## 2022-11-24 NOTE — Telephone Encounter (Signed)
Thank you :)

## 2022-11-26 ENCOUNTER — Other Ambulatory Visit: Payer: Self-pay

## 2022-11-26 ENCOUNTER — Ambulatory Visit (INDEPENDENT_AMBULATORY_CARE_PROVIDER_SITE_OTHER): Payer: Medicaid Other | Admitting: Pharmacist

## 2022-11-26 DIAGNOSIS — E785 Hyperlipidemia, unspecified: Secondary | ICD-10-CM

## 2022-11-26 MED ORDER — ATORVASTATIN CALCIUM 10 MG PO TABS
10.0000 mg | ORAL_TABLET | Freq: Every day | ORAL | 3 refills | Status: DC
Start: 2022-11-26 — End: 2023-03-31

## 2022-11-26 NOTE — Progress Notes (Signed)
HPI: [name] [name] is a 52 y.o. male who presents to the [facility] pharmacy clinic for side effect management.   Patient Active Problem List   Diagnosis Date Noted   Hyperlipidemia 02/26/2022   Gynecomastia 01/20/2019   Low testosterone 01/20/2019   Normocytic anemia 01/20/2019   Hypertension 02/09/2017   Drug-induced hepatitis 08/15/2014   Autoimmune hepatitis (HCC) 05/24/2014   Transaminitis 03/01/2014   Pain of left leg 05/23/2013   Normochromic anemia 11/26/2011   Blood per rectum 11/26/2011   Erectile dysfunction 06/19/2011   Benign essential HTN 07/11/2010   PLANTAR FASCIITIS, RIGHT 11/26/2009   Hepatitis 11/29/2008   SKIN RASH, ALLERGIC 12/14/2007   Human immunodeficiency virus (HIV) disease (HCC) 10/08/2006   INFECTION, CHLAMYDIAL NOS 10/08/2006    Patient's Medications  New Prescriptions   ATORVASTATIN (LIPITOR) 10 MG TABLET    Take 1 tablet (10 mg total) by mouth daily.  Previous Medications   BICTEGRAVIR-EMTRICITABINE-TENOFOVIR AF (BIKTARVY) 50-200-25 MG TABS TABLET    Take 1 tablet by mouth daily.   LISINOPRIL (ZESTRIL) 20 MG TABLET    Take 1 tablet (20 mg total) by mouth daily.   SILDENAFIL (VIAGRA) 50 MG TABLET    Take 50 mg by mouth daily as needed.   VALACYCLOVIR (VALTREX) 1000 MG TABLET    Take 0.5 tablets (500 mg total) by mouth daily.   VITAMIN D, ERGOCALCIFEROL, (DRISDOL) 1.25 MG (50000 UNIT) CAPS CAPSULE    Take 1 capsule (50,000 Units total) by mouth every 7 (seven) days.  Modified Medications   No medications on file  Discontinued Medications   ATORVASTATIN (LIPITOR) 20 MG TABLET    Take 1 tablet (20 mg total) by mouth daily.    Allergies: Allergies  Allergen Reactions   Sustiva [Efavirenz] Other (See Comments)    ? hepatitis    Past Medical History: Past Medical History:  Diagnosis Date   Drug-induced hepatitis 08/15/2014   Gynecomastia 01/20/2019   HIV infection (HCC)    Hyperlipidemia 02/26/2022   Hyperlipidemia 02/26/2022    Hypertension    Low testosterone 01/20/2019   Normocytic anemia 01/20/2019   Transaminitis     Social History: Social History   Socioeconomic History   Marital status: Single    Spouse name: Not on file   Number of children: Not on file   Years of education: Not on file   Highest education level: Not on file  Occupational History   Not on file  Tobacco Use   Smoking status: Never   Smokeless tobacco: Never  Substance and Sexual Activity   Alcohol use: Yes    Alcohol/week: 5.0 standard drinks of alcohol    Types: 5 Standard drinks or equivalent per week   Drug use: Never   Sexual activity: Not on file  Other Topics Concern   Not on file  Social History Narrative   Not on file   Social Determinants of Health   Financial Resource Strain: Not on file  Food Insecurity: Not on file  Transportation Needs: Not on file  Physical Activity: Not on file  Stress: Not on file  Social Connections: Not on file    Labs: Lab Results  Component Value Date   HIV1RNAQUANT Not Detected 02/11/2022   HIV1RNAQUANT Not Detected 01/28/2021   HIV1RNAQUANT 45 (H) 01/24/2020   CD4TABS 809 02/11/2022   CD4TABS 943 01/28/2021   CD4TABS 933 01/24/2020    RPR and STI Lab Results  Component Value Date   LABRPR NON-REACTIVE 02/11/2022   LABRPR NON-REACTIVE  01/28/2021   LABRPR NON-REACTIVE 01/24/2020   LABRPR NON-REACTIVE 07/08/2018   LABRPR NON-REACTIVE 01/26/2018   RPRTITER 1:2 10/10/2010   RPRTITER 1:2 09/25/2010    STI Results GC CT  01/24/2020 10:11 AM Negative  Negative   01/06/2019 12:00 AM Negative  Negative   07/08/2018 12:00 AM Negative    Negative  Negative    Negative   01/26/2018 12:00 AM Negative  Negative   07/26/2015 12:00 AM Negative  Negative   04/10/2015 12:00 AM Negative  Negative   08/15/2014 12:00 AM Negative  Negative   05/24/2014 12:00 AM NG: Negative  CT: Negative     Hepatitis B Lab Results  Component Value Date   HEPBSAB NEG 10/08/2006   HEPBSAG  NEGATIVE 05/24/2014   HEPBCAB NEG 10/08/2006   Hepatitis C No results found for: "HEPCAB", "HCVRNAPCRQN" Hepatitis A No results found for: "HAV" Lipids: Lab Results  Component Value Date   CHOL 163 02/11/2022   TRIG 91 02/11/2022   HDL 44 02/11/2022   CHOLHDL 3.7 02/11/2022   VLDL 27 05/26/2016   LDLCALC 100 (H) 02/11/2022    Assessment: [name] [name] presents to clinic today for side effect management. States he experienced soreness/weakness in his legs after starting atorvastatin 20 mg once daily in late June; describes the soreness as making it difficult for him to get up after sitting or squatting. Asked to rate the soreness/weakness on a scale of 1-10 which he deferred. This presentation demonstrates likely myalgias which can occur in up to 15% of patients starting statin therapy; these are usually not associated with myositis or increases in creatine kinase (Cpk). [employer] recommends attempting to reduce the statin dose for mild symptoms such as [place]'s.  States that after he messaged Dr. Daiva Eves, he split the tablets in half and has been taking 10 mg once daily. Endorses improvement with his soreness since decreasing this dose. Will have him continue splitting his current supply of 20 mg tablets and resend in atorvastatin 10 mg tablets once daily for his next refill. Checking Cpk per Dr. Zenaida Niece Dam's request to ensure normal levels. Patient denies any darkened/brown urine or more severe pain.   Plan: - Continue atorvastatin 10 mg once daily - Check Cpk - Follow up with Dr. Daiva Eves in October for HIV management   Margarite Gouge, PharmD, CPP, BCIDP, AAHIVP Clinical Pharmacist Practitioner Infectious Diseases Clinical Pharmacist [facility] 11/26/2022, 2:52 PM

## 2022-11-27 LAB — CK: Total CK: 96 U/L (ref 44–196)

## 2022-11-27 NOTE — Progress Notes (Signed)
His Cpk was within normal range. See my encounter note from yesterday - he will reduce dose to atorvastatin 10mg  for now. Thanks! - Marchelle Folks

## 2023-02-23 ENCOUNTER — Encounter: Payer: Self-pay | Admitting: Infectious Disease

## 2023-02-25 ENCOUNTER — Other Ambulatory Visit: Payer: Self-pay

## 2023-02-25 ENCOUNTER — Other Ambulatory Visit: Payer: BC Managed Care – PPO

## 2023-02-25 ENCOUNTER — Other Ambulatory Visit (HOSPITAL_COMMUNITY)
Admission: RE | Admit: 2023-02-25 | Discharge: 2023-02-25 | Disposition: A | Discharge status: Home / Self Care | Payer: 59 | Source: Ambulatory Visit | Attending: Infectious Disease | Admitting: Infectious Disease

## 2023-02-25 ENCOUNTER — Other Ambulatory Visit (HOSPITAL_COMMUNITY): Payer: Self-pay

## 2023-02-25 DIAGNOSIS — Z113 Encounter for screening for infections with a predominantly sexual mode of transmission: Secondary | ICD-10-CM

## 2023-02-25 DIAGNOSIS — B20 Human immunodeficiency virus [HIV] disease: Secondary | ICD-10-CM | POA: Diagnosis present

## 2023-02-26 LAB — URINE CYTOLOGY ANCILLARY ONLY
Chlamydia: NEGATIVE
Comment: NEGATIVE
Comment: NORMAL
Neisseria Gonorrhea: NEGATIVE

## 2023-02-26 LAB — T-HELPER CELL (CD4) - (RCID CLINIC ONLY)
CD4 % Helper T Cell: 52 % (ref 33–65)
CD4 T Cell Abs: 976 /uL (ref 400–1790)

## 2023-02-27 LAB — CBC WITH DIFFERENTIAL/PLATELET
Absolute Monocytes: 479 {cells}/uL (ref 200–950)
Basophils Absolute: 29 {cells}/uL (ref 0–200)
Basophils Relative: 0.5 %
Eosinophils Absolute: 57 {cells}/uL (ref 15–500)
Eosinophils Relative: 1 %
HCT: 39.9 % (ref 38.5–50.0)
Hemoglobin: 11.9 g/dL — ABNORMAL LOW (ref 13.2–17.1)
Lymphs Abs: 1944 {cells}/uL (ref 850–3900)
MCH: 22 pg — ABNORMAL LOW (ref 27.0–33.0)
MCHC: 29.8 g/dL — ABNORMAL LOW (ref 32.0–36.0)
MCV: 73.6 fL — ABNORMAL LOW (ref 80.0–100.0)
MPV: 11.3 fL (ref 7.5–12.5)
Monocytes Relative: 8.4 %
Neutro Abs: 3192 {cells}/uL (ref 1500–7800)
Neutrophils Relative %: 56 %
Platelets: 233 10*3/uL (ref 140–400)
RBC: 5.42 10*6/uL (ref 4.20–5.80)
RDW: 16.1 % — ABNORMAL HIGH (ref 11.0–15.0)
Total Lymphocyte: 34.1 %
WBC: 5.7 10*3/uL (ref 3.8–10.8)

## 2023-02-27 LAB — LIPID PANEL
Cholesterol: 130 mg/dL (ref <200)
HDL: 51 mg/dL (ref >=40)
LDL Cholesterol (Calc): 62 mg/dL
Non-HDL Cholesterol (Calc): 79 mg/dL (ref <130)
Total CHOL/HDL Ratio: 2.5 (calc) (ref <5.0)
Triglycerides: 83 mg/dL (ref <150)

## 2023-02-27 LAB — COMPLETE METABOLIC PANEL WITH GFR
AG Ratio: 1.7 (calc) (ref 1.0–2.5)
ALT: 16 U/L (ref 9–46)
AST: 14 U/L (ref 10–35)
Albumin: 4.4 g/dL (ref 3.6–5.1)
Alkaline phosphatase (APISO): 88 U/L (ref 35–144)
BUN: 15 mg/dL (ref 7–25)
CO2: 27 mmol/L (ref 20–32)
Calcium: 9.6 mg/dL (ref 8.6–10.3)
Chloride: 104 mmol/L (ref 98–110)
Creat: 1.26 mg/dL (ref 0.70–1.30)
Globulin: 2.6 g/dL (ref 1.9–3.7)
Glucose, Bld: 95 mg/dL (ref 65–99)
Potassium: 4.3 mmol/L (ref 3.5–5.3)
Sodium: 139 mmol/L (ref 135–146)
Total Bilirubin: 0.4 mg/dL (ref 0.2–1.2)
Total Protein: 7 g/dL (ref 6.1–8.1)
eGFR: 68 mL/min/{1.73_m2} (ref >=60)

## 2023-02-27 LAB — RPR: RPR Ser Ql: NONREACTIVE

## 2023-02-27 LAB — HIV-1 RNA QUANT-NO REFLEX-BLD
HIV 1 RNA Quant: NOT DETECTED {copies}/mL
HIV-1 RNA Quant, Log: NOT DETECTED {Log}

## 2023-03-11 ENCOUNTER — Ambulatory Visit: Payer: BC Managed Care – PPO | Admitting: Infectious Disease

## 2023-03-12 ENCOUNTER — Encounter: Payer: Self-pay | Admitting: Infectious Disease

## 2023-03-12 ENCOUNTER — Other Ambulatory Visit: Payer: Self-pay

## 2023-03-12 ENCOUNTER — Telehealth (INDEPENDENT_AMBULATORY_CARE_PROVIDER_SITE_OTHER): Payer: BC Managed Care – PPO | Admitting: Infectious Disease

## 2023-03-12 VITALS — Wt 202.0 lb

## 2023-03-12 DIAGNOSIS — I10 Essential (primary) hypertension: Secondary | ICD-10-CM | POA: Diagnosis not present

## 2023-03-12 DIAGNOSIS — E785 Hyperlipidemia, unspecified: Secondary | ICD-10-CM

## 2023-03-12 DIAGNOSIS — B009 Herpesviral infection, unspecified: Secondary | ICD-10-CM

## 2023-03-12 DIAGNOSIS — B20 Human immunodeficiency virus [HIV] disease: Secondary | ICD-10-CM | POA: Diagnosis not present

## 2023-03-12 DIAGNOSIS — N529 Male erectile dysfunction, unspecified: Secondary | ICD-10-CM | POA: Diagnosis not present

## 2023-03-12 DIAGNOSIS — Z7185 Encounter for immunization safety counseling: Secondary | ICD-10-CM

## 2023-03-12 HISTORY — DX: Encounter for immunization safety counseling: Z71.85

## 2023-03-12 NOTE — Progress Notes (Signed)
Virtual Visit via Video Note  I connected with [name] [name] on 03/12/23 at  9:15 AM EDT by a video enabled telemedicine application and verified that I am speaking with the correct person using two identifiers.  Location: Patient: Home Provider: [facility]   I discussed the limitations of evaluation and management by telemedicine and the availability of in person appointments. The patient expressed understanding and agreed to proceed.  History of Present Illness:  [name] [name] is a 7 old man with HIV that is well-controlled on Biktarvy also with comorbid hypertension and hyperlipidemia he follows with Dr. Nehemiah Settle for primary care.      Observations/Objective:  Jaethan appeared to be in good spirits and in no acute distress on video feed  Assessment and Plan:   HIV disease:  I have reviewed Aansh Tirone's labs including viral load which was  Lab Results  Component Value Date   HIV1RNAQUANT Not Detected 02/25/2023   and cd4 which was  Lab Results  Component Value Date   CD4TABS 976 02/25/2023     I am continuing patient's prescription for Biktarvy,    HTN: He will continue his lisinopril  Hyperlipidemia will continue Lipitor lipid panel reviewed Lipid Panel     Component Value Date/Time   CHOL 130 02/25/2023 0830   TRIG 83 02/25/2023 0830   HDL 51 02/25/2023 0830   CHOLHDL 2.5 02/25/2023 0830   VLDL 27 05/26/2016 1642   LDLCALC 62 02/25/2023 0830      Erectile dysfunction continue sildenafil  HSV prophylaxis continue Valtrex.  Vaccine counseling recommended updated COVID-19 and flu vaccination  Follow Up Instructions:    I discussed the assessment and treatment plan with the patient. The patient was provided an opportunity to ask questions and all were answered. The patient agreed with the plan and demonstrated an understanding of the instructions.   The patient was advised to call back or seek an in-person evaluation if the symptoms worsen or if the  condition fails to improve as anticipated.  I have personally spent 28 minutes involved in face-to-face and non-face-to-face activities for this patient on the day of the visit. Professional time spent includes the following activities: Preparing to see the patient (review of tests), Obtaining and/or reviewing separately obtained history (admission/discharge record), Performing a medically appropriate examination and/or evaluation , Ordering medications/tests/procedures, referring and communicating with other health care professionals, Documenting clinical information in the EMR, Independently interpreting results (not separately reported), Communicating results to the patient/family/caregiver, Counseling and educating the patient/family/caregiver and Care coordination (not separately reported).     Acey Lav, MD

## 2023-03-14 ENCOUNTER — Encounter: Payer: Self-pay | Admitting: Infectious Disease

## 2023-03-16 ENCOUNTER — Other Ambulatory Visit (HOSPITAL_COMMUNITY): Payer: Self-pay

## 2023-03-16 ENCOUNTER — Other Ambulatory Visit: Payer: Self-pay

## 2023-03-16 ENCOUNTER — Ambulatory Visit (INDEPENDENT_AMBULATORY_CARE_PROVIDER_SITE_OTHER): Payer: BC Managed Care – PPO

## 2023-03-16 DIAGNOSIS — B20 Human immunodeficiency virus [HIV] disease: Secondary | ICD-10-CM

## 2023-03-16 DIAGNOSIS — Z23 Encounter for immunization: Secondary | ICD-10-CM

## 2023-03-16 MED ORDER — BIKTARVY 50-200-25 MG PO TABS: 1.0000 | ORAL_TABLET | Freq: Every day | ORAL | 3 refills | Status: DC

## 2023-03-26 ENCOUNTER — Other Ambulatory Visit: Payer: Self-pay | Admitting: Infectious Disease

## 2023-03-26 DIAGNOSIS — E785 Hyperlipidemia, unspecified: Secondary | ICD-10-CM

## 2023-03-26 NOTE — Telephone Encounter (Signed)
Called patient regarding rx for Lipitor refill. States that he has been taking 10 mg since visit with pharmacy team. Refill request denied.  Patient will take Lipitor 20 mg back to pharmacy and request 10 mg rx be filled. Juanita Laster, RMA

## 2023-03-26 NOTE — Telephone Encounter (Signed)
He has two lipitor prescriptions on file, did you want him on 10mg  or 20mg ? Looks like the last two fills have been the 20mg  dose. Thanks!

## 2023-03-31 MED ORDER — ATORVASTATIN CALCIUM 10 MG PO TABS: 10.0000 mg | ORAL_TABLET | Freq: Every day | ORAL | 3 refills | Status: DC

## 2023-06-05 DIAGNOSIS — B20 Human immunodeficiency virus [HIV] disease: Secondary | ICD-10-CM

## 2023-06-05 MED ORDER — BIKTARVY 50-200-25 MG PO TABS: 1.0000 | ORAL_TABLET | Freq: Every day | ORAL | 1 refills | Status: DC

## 2023-06-05 MED ORDER — BIKTARVY 50-200-25 MG PO TABS: 1.0000 | ORAL_TABLET | Freq: Every day | ORAL | 5 refills | Status: DC

## 2023-06-05 NOTE — Addendum Note (Signed)
Addended by: Linna Hoff D on: 06/05/2023 10:22 AM   Modules accepted: Orders

## 2023-06-17 ENCOUNTER — Other Ambulatory Visit: Payer: Self-pay | Admitting: Pharmacist

## 2023-06-17 DIAGNOSIS — E785 Hyperlipidemia, unspecified: Secondary | ICD-10-CM

## 2023-06-19 ENCOUNTER — Encounter: Payer: Self-pay | Admitting: Infectious Disease

## 2023-07-03 ENCOUNTER — Other Ambulatory Visit (HOSPITAL_COMMUNITY): Payer: Self-pay

## 2023-07-14 ENCOUNTER — Other Ambulatory Visit: Payer: Self-pay | Admitting: Infectious Disease

## 2023-07-14 DIAGNOSIS — B029 Zoster without complications: Secondary | ICD-10-CM

## 2023-07-28 ENCOUNTER — Other Ambulatory Visit: Payer: Self-pay | Admitting: Internal Medicine

## 2023-07-28 DIAGNOSIS — R1310 Dysphagia, unspecified: Secondary | ICD-10-CM

## 2023-08-06 ENCOUNTER — Other Ambulatory Visit: Payer: Self-pay | Admitting: Infectious Disease

## 2023-08-06 DIAGNOSIS — I10 Essential (primary) hypertension: Secondary | ICD-10-CM

## 2023-08-06 NOTE — Telephone Encounter (Signed)
 Okay to refill?

## 2023-08-20 ENCOUNTER — Other Ambulatory Visit

## 2023-09-08 ENCOUNTER — Other Ambulatory Visit: Payer: Self-pay

## 2023-09-08 DIAGNOSIS — B20 Human immunodeficiency virus [HIV] disease: Secondary | ICD-10-CM

## 2023-09-08 DIAGNOSIS — Z113 Encounter for screening for infections with a predominantly sexual mode of transmission: Secondary | ICD-10-CM

## 2023-09-09 LAB — C. TRACHOMATIS/N. GONORRHOEAE RNA
C. trachomatis RNA, TMA: NOT DETECTED
N. gonorrhoeae RNA, TMA: NOT DETECTED

## 2023-09-10 ENCOUNTER — Other Ambulatory Visit: Payer: Self-pay | Admitting: Infectious Disease

## 2023-09-10 DIAGNOSIS — B029 Zoster without complications: Secondary | ICD-10-CM

## 2023-09-10 DIAGNOSIS — E785 Hyperlipidemia, unspecified: Secondary | ICD-10-CM

## 2023-09-10 LAB — CBC WITH DIFFERENTIAL/PLATELET
Absolute Lymphocytes: 2104 {cells}/uL (ref 850–3900)
Absolute Monocytes: 447 {cells}/uL (ref 200–950)
Basophils Absolute: 19 {cells}/uL (ref 0–200)
Basophils Relative: 0.3 %
Eosinophils Absolute: 132 {cells}/uL (ref 15–500)
Eosinophils Relative: 2.1 %
HCT: 37.6 % — ABNORMAL LOW (ref 38.5–50.0)
Hemoglobin: 11.6 g/dL — ABNORMAL LOW (ref 13.2–17.1)
MCH: 22.3 pg — ABNORMAL LOW (ref 27.0–33.0)
MCHC: 30.9 g/dL — ABNORMAL LOW (ref 32.0–36.0)
MCV: 72.2 fL — ABNORMAL LOW (ref 80.0–100.0)
MPV: 11.3 fL (ref 7.5–12.5)
Monocytes Relative: 7.1 %
Neutro Abs: 3597 {cells}/uL (ref 1500–7800)
Neutrophils Relative %: 57.1 %
Platelets: 212 10*3/uL (ref 140–400)
RBC: 5.21 10*6/uL (ref 4.20–5.80)
RDW: 15.5 % — ABNORMAL HIGH (ref 11.0–15.0)
Total Lymphocyte: 33.4 %
WBC: 6.3 10*3/uL (ref 3.8–10.8)

## 2023-09-10 LAB — COMPLETE METABOLIC PANEL WITHOUT GFR
AG Ratio: 2 (calc) (ref 1.0–2.5)
ALT: 15 U/L (ref 9–46)
AST: 15 U/L (ref 10–35)
Albumin: 4.4 g/dL (ref 3.6–5.1)
Alkaline phosphatase (APISO): 78 U/L (ref 35–144)
BUN: 13 mg/dL (ref 7–25)
CO2: 26 mmol/L (ref 20–32)
Calcium: 9.3 mg/dL (ref 8.6–10.3)
Chloride: 106 mmol/L (ref 98–110)
Creat: 1.22 mg/dL (ref 0.70–1.30)
Globulin: 2.2 g/dL (ref 1.9–3.7)
Glucose, Bld: 94 mg/dL (ref 65–99)
Potassium: 4.1 mmol/L (ref 3.5–5.3)
Sodium: 140 mmol/L (ref 135–146)
Total Bilirubin: 0.5 mg/dL (ref 0.2–1.2)
Total Protein: 6.6 g/dL (ref 6.1–8.1)

## 2023-09-10 LAB — LIPID PANEL
Cholesterol: 120 mg/dL (ref <200)
HDL: 46 mg/dL (ref >=40)
LDL Cholesterol (Calc): 56 mg/dL
Non-HDL Cholesterol (Calc): 74 mg/dL (ref <130)
Total CHOL/HDL Ratio: 2.6 (calc) (ref <5.0)
Triglycerides: 99 mg/dL (ref <150)

## 2023-09-10 LAB — T-HELPER CELLS (CD4) COUNT (NOT AT ARMC)
Absolute CD4: 1279 {cells}/uL (ref 490–1740)
CD4 T Helper %: 54 % (ref 30–61)
Total lymphocyte count: 2364 {cells}/uL (ref 850–3900)

## 2023-09-10 LAB — RPR: RPR Ser Ql: NONREACTIVE

## 2023-09-10 LAB — HIV-1 RNA QUANT-NO REFLEX-BLD
HIV 1 RNA Quant: NOT DETECTED {copies}/mL
HIV-1 RNA Quant, Log: NOT DETECTED {Log_copies}/mL

## 2023-09-10 NOTE — Telephone Encounter (Signed)
 Continue Lipitor and Valtrex  per 02/2023 note.   Dominica Kent, BSN, RN

## 2023-09-21 ENCOUNTER — Ambulatory Visit: Payer: Self-pay | Admitting: Infectious Disease

## 2023-09-28 NOTE — Progress Notes (Unsigned)
 Subjective:  Chief complaint: follow-up for HIV disease on medications   Patient ID: [name] [name], male    DOB: [birth year], 53 y.o.   MRN: [id number]  HPI  Discussed the use of AI scribe software for clinical note transcription with the patient, who gave verbal consent to proceed.  History of Present Illness   [name] [name] is a 53 year old male with HIV who presents for routine follow-up.    I raised idea of participation in a clinical trial.  He is on Biktarvy  for HIV management, maintaining an undetectable viral load and a CD4 count of 1,279.   he was concerned about risk of adverse drug reaction if we went to meds in study and the study does involve an NNRTI.    He had previous isolated elevated bilirubin and alkalline phosphatase with icterus in September of 2010 with normal AST/ALT. The latter had been elevated before. This corresponded to when he took a multivitamin which was stopped but there was also concern about EFV induced liver injury and Atripla was also stopped and meds switched.  He has received one shingles vaccine and is due for a second dose. He is concerned about potential adverse effects from vaccines.  No gonorrhea or chlamydia detected in recent urine test.       Past Medical History:  Diagnosis Date   Drug-induced hepatitis 08/15/2014   Gynecomastia 01/20/2019   HIV infection (HCC)    Hyperlipidemia 02/26/2022   Hyperlipidemia 02/26/2022   Hypertension    Low testosterone  01/20/2019   Normocytic anemia 01/20/2019   Transaminitis    Vaccine counseling 03/12/2023    No past surgical history on file.  No family history on file.    Social History   Socioeconomic History   Marital status: Single    Spouse name: Not on file   Number of children: Not on file   Years of education: Not on file   Highest education level: Not on file  Occupational History   Not on file  Tobacco Use   Smoking status: Never   Smokeless tobacco: Never   Substance and Sexual Activity   Alcohol use: Not Currently    Alcohol/week: 5.0 standard drinks of alcohol    Types: 5 Standard drinks or equivalent per week   Drug use: Never   Sexual activity: Not on file  Other Topics Concern   Not on file  Social History Narrative   Not on file   Social Drivers of Health   Financial Resource Strain: Not on file  Food Insecurity: Not on file  Transportation Needs: Not on file  Physical Activity: Not on file  Stress: Not on file  Social Connections: Not on file    Allergies  Allergen Reactions   Sustiva [Efavirenz] Other (See Comments)    ? hepatitis     Current Outpatient Medications:    atorvastatin  (LIPITOR) 10 MG tablet, TAKE 1 TABLET DAILY, Disp: 90 tablet, Rfl: 0   bictegravir-emtricitabine -tenofovir  AF (BIKTARVY ) 50-200-25 MG TABS tablet, Take 1 tablet by mouth daily., Disp: 90 tablet, Rfl: 1   lisinopril  (ZESTRIL ) 20 MG tablet, TAKE 1 TABLET DAILY, Disp: 90 tablet, Rfl: 1   sildenafil  (VIAGRA ) 50 MG tablet, Take 50 mg by mouth daily as needed., Disp: , Rfl:    valACYclovir  (VALTREX ) 1000 MG tablet, TAKE 1/2 TABLET DAILY., Disp: 45 tablet, Rfl: 1   Vitamin D , Ergocalciferol , (DRISDOL ) 1.25 MG (50000 UNIT) CAPS capsule, Take 1 capsule (50,000 Units total) by mouth every 7 (  seven) days., Disp: 5 capsule, Rfl: 0   Review of Systems  Constitutional:  Negative for activity change, appetite change, chills, diaphoresis, fatigue, fever and unexpected weight change.  HENT:  Negative for congestion, rhinorrhea, sinus pressure, sneezing, sore throat and trouble swallowing.   Eyes:  Negative for photophobia and visual disturbance.  Respiratory:  Negative for cough, chest tightness, shortness of breath, wheezing and stridor.   Cardiovascular:  Negative for chest pain, palpitations and leg swelling.  Gastrointestinal:  Negative for abdominal distention, abdominal pain, anal bleeding, blood in stool, constipation, diarrhea, nausea and vomiting.   Genitourinary:  Negative for difficulty urinating, dysuria, flank pain and hematuria.  Musculoskeletal:  Negative for arthralgias, back pain, gait problem, joint swelling and myalgias.  Skin:  Negative for color change, pallor, rash and wound.  Neurological:  Negative for dizziness, tremors, weakness and light-headedness.  Hematological:  Negative for adenopathy. Does not bruise/bleed easily.  Psychiatric/Behavioral:  Negative for agitation, behavioral problems, confusion, decreased concentration, dysphoric mood and sleep disturbance.        Objective:   Physical Exam Constitutional:      Appearance: He is well-developed.  HENT:     Head: Normocephalic and atraumatic.  Eyes:     Conjunctiva/sclera: Conjunctivae normal.  Cardiovascular:     Rate and Rhythm: Normal rate and regular rhythm.  Pulmonary:     Effort: Pulmonary effort is normal. No respiratory distress.     Breath sounds: No wheezing.  Abdominal:     General: There is no distension.     Palpations: Abdomen is soft.  Musculoskeletal:        General: No tenderness. Normal range of motion.     Cervical back: Normal range of motion and neck supple.  Skin:    General: Skin is warm and dry.     Coloration: Skin is not pale.     Findings: No erythema or rash.  Neurological:     General: No focal deficit present.     Mental Status: He is alert and oriented to person, place, and time.  Psychiatric:        Mood and Affect: Mood normal.        Behavior: Behavior normal.        Thought Content: Thought content normal.        Judgment: Judgment normal.           Assessment & Plan:   Assessment and Plan    HIV infection, controlled HIV well-controlled with Biktarvy . Viral load undetectable, CD4 count 1279. Discussed CD4 variability and clinical study participation concerns. - Continue Biktarvy . -I am doing more of an investigation into the alleged hepatotoxicty of EFV back in 2010 --would be good to get  notes   Rectal cancer screening: - Perform anal Pap smear.   Vaccine counseling: - Administer pneumonia vaccine. - Administer second shingles vaccine.  Allergic reaction to efavirenz see above      Hyperlipidemia: continue lipitor  HTN: continue lisinopril 

## 2023-09-29 ENCOUNTER — Ambulatory Visit (INDEPENDENT_AMBULATORY_CARE_PROVIDER_SITE_OTHER): Payer: Medicaid Other | Admitting: Infectious Disease

## 2023-09-29 ENCOUNTER — Encounter: Payer: Self-pay | Admitting: Infectious Disease

## 2023-09-29 ENCOUNTER — Other Ambulatory Visit: Payer: Self-pay

## 2023-09-29 VITALS — BP 135/87 | HR 55 | Resp 16 | Ht 68.0 in | Wt 206.0 lb

## 2023-09-29 DIAGNOSIS — B20 Human immunodeficiency virus [HIV] disease: Secondary | ICD-10-CM

## 2023-09-29 DIAGNOSIS — Z7185 Encounter for immunization safety counseling: Secondary | ICD-10-CM | POA: Diagnosis not present

## 2023-09-29 DIAGNOSIS — Z23 Encounter for immunization: Secondary | ICD-10-CM

## 2023-09-29 DIAGNOSIS — E785 Hyperlipidemia, unspecified: Secondary | ICD-10-CM | POA: Diagnosis not present

## 2023-09-29 DIAGNOSIS — I10 Essential (primary) hypertension: Secondary | ICD-10-CM | POA: Diagnosis not present

## 2023-09-29 DIAGNOSIS — C20 Malignant neoplasm of rectum: Secondary | ICD-10-CM

## 2023-09-29 DIAGNOSIS — Z1212 Encounter for screening for malignant neoplasm of rectum: Secondary | ICD-10-CM

## 2023-09-29 MED ORDER — ATORVASTATIN CALCIUM 10 MG PO TABS
10.0000 mg | ORAL_TABLET | Freq: Every day | ORAL | 0 refills | Status: DC
Start: 2023-09-29 — End: 2024-02-16

## 2023-09-29 MED ORDER — BIKTARVY 50-200-25 MG PO TABS: 1.0000 | ORAL_TABLET | Freq: Every day | ORAL | 1 refills | Status: DC

## 2023-09-29 MED ORDER — LISINOPRIL 20 MG PO TABS: 20.0000 mg | ORAL_TABLET | Freq: Every day | ORAL | 1 refills | Status: DC

## 2023-10-01 LAB — CYTOLOGY - NON PAP

## 2023-10-16 NOTE — Progress Notes (Signed)
 The ASCVD Risk score (Arnett DK, et al., 2019) failed to calculate for the following reasons:   The valid total cholesterol range is 130 to 320 mg/dL  Arlon Bergamo, BSN, RN

## 2023-11-17 ENCOUNTER — Other Ambulatory Visit (HOSPITAL_COMMUNITY): Payer: Self-pay

## 2024-02-16 ENCOUNTER — Encounter: Payer: Self-pay | Admitting: Infectious Disease

## 2024-02-16 ENCOUNTER — Other Ambulatory Visit: Payer: Self-pay

## 2024-02-16 DIAGNOSIS — E785 Hyperlipidemia, unspecified: Secondary | ICD-10-CM

## 2024-02-16 MED ORDER — ATORVASTATIN CALCIUM 10 MG PO TABS
10.0000 mg | ORAL_TABLET | Freq: Every day | ORAL | 1 refills | Status: DC
Start: 2024-02-16 — End: 2024-02-22

## 2024-02-22 NOTE — Progress Notes (Unsigned)
   Chief complaint: follow-up for HIV disease on medications  Subjective:    Patient ID: [name] [name], male    DOB: April 21, 1971, 53 y.o.   MRN: [id number]  HPI  Past Medical History:  Diagnosis Date   Drug-induced hepatitis 08/15/2014   Gynecomastia 01/20/2019   HIV infection (HCC)    Hyperlipidemia 02/26/2022   Hyperlipidemia 02/26/2022   Hypertension    Low testosterone  01/20/2019   Normocytic anemia 01/20/2019   Transaminitis    Vaccine counseling 03/12/2023    No past surgical history on file.  No family history on file.    Social History   Socioeconomic History   Marital status: Single    Spouse name: Not on file   Number of children: Not on file   Years of education: Not on file   Highest education level: Not on file  Occupational History   Not on file  Tobacco Use   Smoking status: Never    Passive exposure: Never   Smokeless tobacco: Never  Vaping Use   Vaping status: Never Used  Substance and Sexual Activity   Alcohol use: Not Currently    Alcohol/week: 5.0 standard drinks of alcohol    Types: 5 Standard drinks or equivalent per week   Drug use: Never   Sexual activity: Yes    Birth control/protection: Condom  Other Topics Concern   Not on file  Social History Narrative   Not on file   Social Drivers of Health   Financial Resource Strain: Not on file  Food Insecurity: Not on file  Transportation Needs: Not on file  Physical Activity: Not on file  Stress: Not on file  Social Connections: Not on file    Allergies  Allergen Reactions   Sustiva [Efavirenz] Other (See Comments)    ? hepatitis   Efavirenz-Emtricitab-Tenofo Df Hives   Sulfamethoxazole-Trimethoprim Itching     Current Outpatient Medications:    atorvastatin  (LIPITOR) 10 MG tablet, Take 1 tablet (10 mg total) by mouth daily., Disp: 90 tablet, Rfl: 1   bictegravir-emtricitabine -tenofovir  AF (BIKTARVY ) 50-200-25 MG TABS tablet, Take 1 tablet by mouth daily., Disp: 90 tablet,  Rfl: 1   lisinopril  (ZESTRIL ) 20 MG tablet, Take 1 tablet (20 mg total) by mouth daily., Disp: 90 tablet, Rfl: 1   sildenafil  (VIAGRA ) 50 MG tablet, Take 50 mg by mouth daily as needed., Disp: , Rfl:    valACYclovir  (VALTREX ) 1000 MG tablet, TAKE 1/2 TABLET DAILY., Disp: 45 tablet, Rfl: 1   Vitamin D , Ergocalciferol , (DRISDOL ) 1.25 MG (50000 UNIT) CAPS capsule, Take 1 capsule (50,000 Units total) by mouth every 7 (seven) days., Disp: 5 capsule, Rfl: 0   Review of Systems     Objective:   Physical Exam        Assessment & Plan:

## 2024-02-23 ENCOUNTER — Other Ambulatory Visit (HOSPITAL_COMMUNITY)
Admission: RE | Admit: 2024-02-23 | Discharge: 2024-02-23 | Disposition: A | Discharge status: Home / Self Care | Source: Ambulatory Visit | Attending: Infectious Disease | Admitting: Infectious Disease

## 2024-02-23 ENCOUNTER — Other Ambulatory Visit: Payer: Self-pay

## 2024-02-23 ENCOUNTER — Encounter: Payer: Self-pay | Admitting: Infectious Disease

## 2024-02-23 ENCOUNTER — Ambulatory Visit (INDEPENDENT_AMBULATORY_CARE_PROVIDER_SITE_OTHER): Admitting: Infectious Disease

## 2024-02-23 VITALS — BP 120/86 | HR 74 | Temp 98.5°F | Ht 68.0 in | Wt 210.0 lb

## 2024-02-23 DIAGNOSIS — B029 Zoster without complications: Secondary | ICD-10-CM | POA: Diagnosis present

## 2024-02-23 DIAGNOSIS — R12 Heartburn: Secondary | ICD-10-CM | POA: Insufficient documentation

## 2024-02-23 DIAGNOSIS — Z113 Encounter for screening for infections with a predominantly sexual mode of transmission: Secondary | ICD-10-CM

## 2024-02-23 DIAGNOSIS — B20 Human immunodeficiency virus [HIV] disease: Secondary | ICD-10-CM | POA: Insufficient documentation

## 2024-02-23 DIAGNOSIS — Z7185 Encounter for immunization safety counseling: Secondary | ICD-10-CM | POA: Diagnosis present

## 2024-02-23 DIAGNOSIS — I10 Essential (primary) hypertension: Secondary | ICD-10-CM

## 2024-02-23 DIAGNOSIS — E785 Hyperlipidemia, unspecified: Secondary | ICD-10-CM

## 2024-02-23 DIAGNOSIS — K219 Gastro-esophageal reflux disease without esophagitis: Secondary | ICD-10-CM

## 2024-02-23 DIAGNOSIS — Z23 Encounter for immunization: Secondary | ICD-10-CM | POA: Diagnosis not present

## 2024-02-23 MED ORDER — ATORVASTATIN CALCIUM 10 MG PO TABS: 10.0000 mg | ORAL_TABLET | Freq: Every day | ORAL | 1 refills | Status: AC

## 2024-02-23 MED ORDER — PANTOPRAZOLE SODIUM 40 MG PO TBEC: 40.0000 mg | DELAYED_RELEASE_TABLET | Freq: Every day | ORAL | 5 refills | Status: AC

## 2024-02-23 MED ORDER — BIKTARVY 50-200-25 MG PO TABS: 1.0000 | ORAL_TABLET | Freq: Every day | ORAL | 1 refills | Status: AC

## 2024-02-23 MED ORDER — LISINOPRIL 20 MG PO TABS: 20.0000 mg | ORAL_TABLET | Freq: Every day | ORAL | 1 refills | Status: AC

## 2024-02-23 MED ORDER — VALACYCLOVIR HCL 1 G PO TABS: 500.0000 mg | ORAL_TABLET | Freq: Every day | ORAL | 1 refills | Status: AC

## 2024-02-24 LAB — CYTOLOGY, (ORAL, ANAL, URETHRAL) ANCILLARY ONLY
Chlamydia: NEGATIVE
Chlamydia: NEGATIVE
Comment: NEGATIVE
Comment: NEGATIVE
Comment: NORMAL
Comment: NORMAL
Neisseria Gonorrhea: NEGATIVE
Neisseria Gonorrhea: NEGATIVE

## 2024-02-24 LAB — T-HELPER CELLS (CD4) COUNT (NOT AT ARMC)
CD4 % Helper T Cell: 55 % (ref 33–65)
CD4 T Cell Abs: 1232 /uL (ref 400–1790)

## 2024-02-24 LAB — URINE CYTOLOGY ANCILLARY ONLY
Chlamydia: NEGATIVE
Comment: NEGATIVE
Comment: NORMAL
Neisseria Gonorrhea: NEGATIVE

## 2024-02-25 LAB — COMPLETE METABOLIC PANEL WITHOUT GFR
AG Ratio: 1.9 (calc) (ref 1.0–2.5)
ALT: 15 U/L (ref 9–46)
AST: 13 U/L (ref 10–35)
Albumin: 4.8 g/dL (ref 3.6–5.1)
Alkaline phosphatase (APISO): 91 U/L (ref 35–144)
BUN: 14 mg/dL (ref 7–25)
CO2: 27 mmol/L (ref 20–32)
Calcium: 9.7 mg/dL (ref 8.6–10.3)
Chloride: 104 mmol/L (ref 98–110)
Creat: 1.17 mg/dL (ref 0.70–1.30)
Globulin: 2.5 g/dL (ref 1.9–3.7)
Glucose, Bld: 128 mg/dL — ABNORMAL HIGH (ref 65–99)
Potassium: 4.3 mmol/L (ref 3.5–5.3)
Sodium: 138 mmol/L (ref 135–146)
Total Bilirubin: 0.4 mg/dL (ref 0.2–1.2)
Total Protein: 7.3 g/dL (ref 6.1–8.1)

## 2024-02-25 LAB — CBC WITH DIFFERENTIAL/PLATELET
Absolute Lymphocytes: 2422 {cells}/uL (ref 850–3900)
Absolute Monocytes: 385 {cells}/uL (ref 200–950)
Basophils Absolute: 28 {cells}/uL (ref 0–200)
Basophils Relative: 0.4 %
Eosinophils Absolute: 70 {cells}/uL (ref 15–500)
Eosinophils Relative: 1 %
HCT: 40.4 % (ref 38.5–50.0)
Hemoglobin: 12.2 g/dL — ABNORMAL LOW (ref 13.2–17.1)
MCH: 22.3 pg — ABNORMAL LOW (ref 27.0–33.0)
MCHC: 30.2 g/dL — ABNORMAL LOW (ref 32.0–36.0)
MCV: 74 fL — ABNORMAL LOW (ref 80.0–100.0)
MPV: 11.5 fL (ref 7.5–12.5)
Monocytes Relative: 5.5 %
Neutro Abs: 4095 {cells}/uL (ref 1500–7800)
Neutrophils Relative %: 58.5 %
Platelets: 226 Thousand/uL (ref 140–400)
RBC: 5.46 Million/uL (ref 4.20–5.80)
RDW: 16 % — ABNORMAL HIGH (ref 11.0–15.0)
Total Lymphocyte: 34.6 %
WBC: 7 Thousand/uL (ref 3.8–10.8)

## 2024-02-25 LAB — LIPID PANEL
Cholesterol: 118 mg/dL (ref <200)
HDL: 52 mg/dL (ref >=40)
LDL Cholesterol (Calc): 52 mg/dL
Non-HDL Cholesterol (Calc): 66 mg/dL (ref <130)
Total CHOL/HDL Ratio: 2.3 (calc) (ref <5.0)
Triglycerides: 63 mg/dL (ref <150)

## 2024-02-25 LAB — HIV-1 RNA QUANT-NO REFLEX-BLD
HIV 1 RNA Quant: NOT DETECTED {copies}/mL
HIV-1 RNA Quant, Log: NOT DETECTED {Log_copies}/mL

## 2024-02-25 LAB — RPR: RPR Ser Ql: NONREACTIVE

## 2024-03-29 ENCOUNTER — Ambulatory Visit: Admitting: Infectious Disease

## 2024-09-14 ENCOUNTER — Other Ambulatory Visit

## 2024-09-28 ENCOUNTER — Encounter: Payer: Self-pay | Admitting: Infectious Disease
